# Patient Record
Sex: Female | Born: 1935 | Race: White | Hispanic: No | Marital: Married | Smoking: Never smoker
Health system: Southern US, Community
[De-identification: ages and names within clinical notes are randomized; demographics above are authoritative.]

## PROBLEM LIST (undated history)

## (undated) DIAGNOSIS — I493 Ventricular premature depolarization: Secondary | ICD-10-CM

## (undated) DIAGNOSIS — I491 Atrial premature depolarization: Secondary | ICD-10-CM

## (undated) DIAGNOSIS — I10 Essential (primary) hypertension: Secondary | ICD-10-CM

## (undated) DIAGNOSIS — E079 Disorder of thyroid, unspecified: Secondary | ICD-10-CM

## (undated) DIAGNOSIS — K5732 Diverticulitis of large intestine without perforation or abscess without bleeding: Secondary | ICD-10-CM

## (undated) HISTORY — DX: Ventricular premature depolarization: I49.3

## (undated) HISTORY — DX: Disorder of thyroid, unspecified: E07.9

## (undated) HISTORY — PX: STONE EXTRACTION WITH BASKET: SHX5318

## (undated) HISTORY — DX: Essential (primary) hypertension: I10

## (undated) HISTORY — DX: Diverticulitis of large intestine without perforation or abscess without bleeding: K57.32

## (undated) HISTORY — DX: Atrial premature depolarization: I49.1

---

## 1998-05-02 ENCOUNTER — Other Ambulatory Visit: Admission: RE | Admit: 1998-05-02 | Discharge: 1998-05-02 | Payer: Self-pay | Admitting: Internal Medicine

## 1999-11-20 ENCOUNTER — Ambulatory Visit (HOSPITAL_COMMUNITY): Admission: RE | Admit: 1999-11-20 | Discharge: 1999-11-20 | Payer: Self-pay | Admitting: Gastroenterology

## 2000-07-27 ENCOUNTER — Other Ambulatory Visit: Admission: RE | Admit: 2000-07-27 | Discharge: 2000-07-27 | Payer: Self-pay | Admitting: Internal Medicine

## 2001-08-02 ENCOUNTER — Other Ambulatory Visit: Admission: RE | Admit: 2001-08-02 | Discharge: 2001-08-02 | Payer: Self-pay | Admitting: Internal Medicine

## 2002-11-07 ENCOUNTER — Other Ambulatory Visit: Admission: RE | Admit: 2002-11-07 | Discharge: 2002-11-07 | Payer: Self-pay | Admitting: Internal Medicine

## 2006-01-04 ENCOUNTER — Other Ambulatory Visit: Admission: RE | Admit: 2006-01-04 | Discharge: 2006-01-04 | Payer: Self-pay | Admitting: Internal Medicine

## 2009-01-03 ENCOUNTER — Ambulatory Visit: Payer: Self-pay | Admitting: Internal Medicine

## 2009-07-08 ENCOUNTER — Ambulatory Visit: Payer: Self-pay | Admitting: Internal Medicine

## 2010-01-07 ENCOUNTER — Ambulatory Visit: Payer: Self-pay | Admitting: Internal Medicine

## 2010-01-17 ENCOUNTER — Ambulatory Visit: Payer: Self-pay | Admitting: Internal Medicine

## 2010-07-08 ENCOUNTER — Ambulatory Visit: Payer: Self-pay | Admitting: Internal Medicine

## 2011-01-16 ENCOUNTER — Ambulatory Visit (INDEPENDENT_AMBULATORY_CARE_PROVIDER_SITE_OTHER): Payer: Medicare Other | Admitting: Internal Medicine

## 2011-01-16 ENCOUNTER — Encounter: Payer: Self-pay | Admitting: Internal Medicine

## 2011-01-16 DIAGNOSIS — I499 Cardiac arrhythmia, unspecified: Secondary | ICD-10-CM

## 2011-01-16 DIAGNOSIS — Z23 Encounter for immunization: Secondary | ICD-10-CM

## 2011-01-16 DIAGNOSIS — F411 Generalized anxiety disorder: Secondary | ICD-10-CM

## 2011-01-16 DIAGNOSIS — E559 Vitamin D deficiency, unspecified: Secondary | ICD-10-CM

## 2011-01-16 DIAGNOSIS — F419 Anxiety disorder, unspecified: Secondary | ICD-10-CM

## 2011-01-16 DIAGNOSIS — I10 Essential (primary) hypertension: Secondary | ICD-10-CM

## 2011-01-16 DIAGNOSIS — Z0000 Encounter for general adult medical examination without abnormal findings: Secondary | ICD-10-CM

## 2011-01-16 DIAGNOSIS — M810 Age-related osteoporosis without current pathological fracture: Secondary | ICD-10-CM

## 2011-01-16 LAB — CBC WITH DIFFERENTIAL/PLATELET
Basophils Absolute: 0 10*3/uL (ref 0.0–0.1)
Eosinophils Relative: 1 % (ref 0–5)
HCT: 41.3 % (ref 36.0–46.0)
Lymphocytes Relative: 28 % (ref 12–46)
MCHC: 32.4 g/dL (ref 30.0–36.0)
MCV: 93.7 fL (ref 78.0–100.0)
Monocytes Absolute: 0.5 10*3/uL (ref 0.1–1.0)
Monocytes Relative: 9 % (ref 3–12)
RDW: 13.2 % (ref 11.5–15.5)
WBC: 5.7 10*3/uL (ref 4.0–10.5)

## 2011-01-16 LAB — POCT URINALYSIS DIPSTICK
Bilirubin, UA: NEGATIVE
Blood, UA: NEGATIVE
Glucose, UA: NEGATIVE
Ketones, UA: NEGATIVE
Spec Grav, UA: 1.025

## 2011-01-16 MED ORDER — TETANUS-DIPHTH-ACELL PERTUSSIS 5-2.5-18.5 LF-MCG/0.5 IM SUSP
0.5000 mL | Freq: Once | INTRAMUSCULAR | Status: AC
  Administered 2011-01-16: 0.5 mL via INTRAMUSCULAR

## 2011-01-17 LAB — COMPREHENSIVE METABOLIC PANEL
AST: 16 U/L (ref 0–37)
BUN: 21 mg/dL (ref 6–23)
CO2: 30 mEq/L (ref 19–32)
Calcium: 9.7 mg/dL (ref 8.4–10.5)
Chloride: 100 mEq/L (ref 96–112)
Creat: 0.96 mg/dL (ref 0.50–1.10)
Glucose, Bld: 91 mg/dL (ref 70–99)

## 2011-01-17 LAB — LIPID PANEL
Cholesterol: 203 mg/dL — ABNORMAL HIGH (ref 0–200)
HDL: 61 mg/dL (ref >39)
Total CHOL/HDL Ratio: 3.3 Ratio
Triglycerides: 68 mg/dL (ref <150)

## 2011-01-19 ENCOUNTER — Encounter: Payer: Self-pay | Admitting: Internal Medicine

## 2011-02-14 ENCOUNTER — Encounter: Payer: Self-pay | Admitting: Internal Medicine

## 2011-02-14 DIAGNOSIS — M810 Age-related osteoporosis without current pathological fracture: Secondary | ICD-10-CM | POA: Insufficient documentation

## 2011-02-14 DIAGNOSIS — I10 Essential (primary) hypertension: Secondary | ICD-10-CM | POA: Insufficient documentation

## 2011-02-14 DIAGNOSIS — E559 Vitamin D deficiency, unspecified: Secondary | ICD-10-CM | POA: Insufficient documentation

## 2011-02-14 NOTE — Progress Notes (Signed)
  Subjective:    Patient ID: Krystal Johns, female    DOB: Aug 10, 1923, 77 y.o.   MRN: [id number]  HPI pleasant white female with history of hypertension vitamin D deficiency and osteoporosis. Remote history of kidney stones status post stone basket extraction 1987. Has had urinary tract infections in 2003 in 2004. Had acute diverticulitis with septicemia May 1989. Last colonoscopy was 2001. Has declined further colonoscopies. Zostavax vaccine given today. No recent mammogram on file. Patient was reminded. Pneumovax immunization 2005. Patient takes vitamin D supplement daily. Takes baby aspirin daily. Is on HCTZ 25 mg daily and Zestril 20 mg daily for hypertension. She and her husband have felt a small [place] near family members. He has developed severe Parkinson's disease and is requiring some in-home assistance after [facility]. This is all been quite stressful for her. Some night she doesn't sleep well. She looks tired.    Review of Systems  Constitutional: Negative.   HENT: Negative.   Eyes: Negative.   Respiratory: Negative.   Cardiovascular: Negative.   Gastrointestinal: Negative.   Genitourinary: Negative.   Musculoskeletal: Negative.   Neurological: Negative.   Hematological: Negative.   Psychiatric/Behavioral: Negative.        Objective:   Physical Exam  Constitutional: She is oriented to person, place, and time. She appears well-nourished.  HENT:  Head: Normocephalic and atraumatic.  Right Ear: External ear normal.  Left Ear: External ear normal.  Mouth/Throat: Oropharynx is clear and moist. No oropharyngeal exudate.  Eyes: Pupils are equal, round, and reactive to light. No scleral icterus.  Neck: Neck supple. No JVD present. No thyromegaly present.  Cardiovascular: Normal rate, regular rhythm and normal heart sounds.   No murmur heard. Pulmonary/Chest: Effort normal and breath sounds normal. She has no wheezes.  Abdominal: Soft. Bowel sounds  are normal. She exhibits no distension and no mass. There is no tenderness.  Musculoskeletal: Normal range of motion. She exhibits no edema.  Lymphadenopathy:    She has no cervical adenopathy.  Neurological: She is alert and oriented to person, place, and time. She has normal reflexes. No cranial nerve deficit.  Skin: Skin is warm and dry. No rash noted. She is not diaphoretic.  Psychiatric: She has a normal mood and affect. Her behavior is normal. Judgment and thought content normal.          Assessment & Plan:  Hypertension well controlled on current regimen  Osteoporosis currently on vitamin D supplement . May take calcium supplements 600 mg twice daily  History of vitamin D deficiency  Remote history of kidney stone  History of urinary tract infections  History of acute diverticulitis with septicemia 1989  Plan patient is to return in 6 months for office visit and followup of hypertension and other issues

## 2011-02-16 ENCOUNTER — Other Ambulatory Visit: Payer: Self-pay | Admitting: *Deleted

## 2011-02-16 MED ORDER — LISINOPRIL 20 MG PO TABS: 20.0000 mg | ORAL_TABLET | Freq: Every day | ORAL | Status: DC

## 2011-03-10 ENCOUNTER — Ambulatory Visit (INDEPENDENT_AMBULATORY_CARE_PROVIDER_SITE_OTHER): Payer: Medicare Other | Admitting: Internal Medicine

## 2011-03-10 ENCOUNTER — Encounter: Payer: Self-pay | Admitting: Internal Medicine

## 2011-03-10 VITALS — BP 140/74 | Temp 98.2°F | Ht 60.0 in | Wt 103.0 lb

## 2011-03-10 DIAGNOSIS — I10 Essential (primary) hypertension: Secondary | ICD-10-CM

## 2011-03-10 DIAGNOSIS — L02429 Furuncle of limb, unspecified: Secondary | ICD-10-CM

## 2011-03-10 DIAGNOSIS — L02434 Carbuncle of left upper limb: Secondary | ICD-10-CM

## 2011-03-10 NOTE — Progress Notes (Signed)
  Subjective:    Patient ID: Krystal Johns, female    DOB: 05/20/1923, 77 y.o.   MRN: [id number]  HPI patient concerned about red tender nodule on her forearm. Does not recall any injury or an insect bite. Says that a few days ago there was a significant amount of surrounding erythema that has dissipated. No fever or shaking chills. Tetanus immunization update given 01/16/2011    Review of Systems     Objective:   Physical Exam palpable nodule about the size of a nickel on the dorsal left forearm that appears to have a tiny collection of pus at approximately 9 o'clock        Assessment & Plan:  Carbuncle left forearm  Advise warm hot compresses for 20 minutes 4 times daily. Prescription for doxycycline 100 mg twice daily for 10 days. Call if not better in 10 days or sooner if worse.

## 2011-03-10 NOTE — Patient Instructions (Signed)
Take doxycycline twice daily with food as prescribed. Apply warm hot compresses 20 minutes 4 times daily. Call if not better in 10 days or sooner if worse

## 2011-05-08 ENCOUNTER — Ambulatory Visit: Payer: Medicare Other | Admitting: Internal Medicine

## 2011-05-29 ENCOUNTER — Telehealth: Payer: Self-pay | Admitting: *Deleted

## 2011-05-29 NOTE — Telephone Encounter (Signed)
Error

## 2011-07-16 ENCOUNTER — Encounter (HOSPITAL_COMMUNITY): Payer: Self-pay | Admitting: Adult Health

## 2011-07-16 ENCOUNTER — Emergency Department (HOSPITAL_COMMUNITY)
Admission: EM | Admit: 2011-07-16 | Discharge: 2011-07-16 | Disposition: A | Discharge status: Home / Self Care | Payer: Medicare Other | Attending: Emergency Medicine | Admitting: Emergency Medicine

## 2011-07-16 ENCOUNTER — Emergency Department (HOSPITAL_COMMUNITY): Payer: Medicare Other

## 2011-07-16 ENCOUNTER — Telehealth: Payer: Self-pay | Admitting: Internal Medicine

## 2011-07-16 DIAGNOSIS — J40 Bronchitis, not specified as acute or chronic: Secondary | ICD-10-CM | POA: Insufficient documentation

## 2011-07-16 DIAGNOSIS — R509 Fever, unspecified: Secondary | ICD-10-CM | POA: Insufficient documentation

## 2011-07-16 DIAGNOSIS — R05 Cough: Secondary | ICD-10-CM | POA: Insufficient documentation

## 2011-07-16 DIAGNOSIS — Z7982 Long term (current) use of aspirin: Secondary | ICD-10-CM | POA: Insufficient documentation

## 2011-07-16 DIAGNOSIS — I10 Essential (primary) hypertension: Secondary | ICD-10-CM | POA: Insufficient documentation

## 2011-07-16 DIAGNOSIS — R059 Cough, unspecified: Secondary | ICD-10-CM | POA: Insufficient documentation

## 2011-07-16 MED ORDER — MOXIFLOXACIN HCL 400 MG PO TABS
400.0000 mg | ORAL_TABLET | Freq: Once | ORAL | Status: AC
  Administered 2011-07-16: 400 mg via ORAL
  Filled 2011-07-16: qty 1

## 2011-07-16 MED ORDER — MOXIFLOXACIN HCL 400 MG PO TABS: 400.0000 mg | ORAL_TABLET | Freq: Every day | ORAL | Status: AC

## 2011-07-16 NOTE — ED Notes (Signed)
Pt st's she has been feeling "sick" with a bad, non-productive cough x2 days.  Denies SOB, no chest or abdominal pain, no h/a, no n/v/d, also denies sore throat, no dizziness or confusion.  Lung sounds clear and equal bilaterally.  St's she had a 103 temp this am.

## 2011-07-16 NOTE — ED Notes (Signed)
Reports cough, fever and hoarseness for 2 days. Denies productive cough. Bilateral lung sounds clear.

## 2011-07-16 NOTE — ED Provider Notes (Signed)
History     CSN: [id number] Arrival date & time: 07/16/2011  5:33 PM   First MD Initiated Contact with Patient 07/16/11 2110      Chief Complaint  Patient presents with  . Cough  . Fever    (Consider location/radiation/quality/duration/timing/severity/associated sxs/prior treatment) HPI Comments: Pt with cough since yesterday, says that it was worse yesterday than today, low grade fever this am.  No cp/sob, no myalgias.  No dizziness.  Didn't want to come to ED, but home health nurse at home for her husband urged her to get check ed out  Patient is a 77 y.o. female presenting with cough. The history is provided by the patient.  Cough This is a new problem. The current episode started yesterday. Episode frequency: intermittently. The cough is non-productive. The maximum temperature recorded prior to her arrival was 100 to 100.9 F. The fever has been present for less than 1 day. Associated symptoms include rhinorrhea. Pertinent negatives include no chest pain, no chills, no sweats, no headaches, no myalgias, no shortness of breath and no wheezing. She has tried nothing for the symptoms. She is not a smoker.    Past Medical History  Diagnosis Date  . Diverticulitis of colon   . Osteoporosis   . Hypertension   . Vitamin D deficiency   . Asymptomatic PVCs   . PAC (premature atrial contraction)     asymptomatic    Past Surgical History  Procedure Date  . Stone extraction with basket     Family History  Problem Relation Age of Onset  . Heart disease Mother   . Hypertension Mother   . Heart disease Father   . Cancer Brother     History  Substance Use Topics  . Smoking status: Never Smoker   . Smokeless tobacco: Never Used  . Alcohol Use: No    OB History    Grav Para Term Preterm Abortions TAB SAB Ect Mult Living                  Review of Systems  Constitutional: Positive for fever. Negative for chills, diaphoresis and fatigue.  HENT: Positive for rhinorrhea.  Negative for congestion and sneezing.   Eyes: Negative.   Respiratory: Positive for cough. Negative for chest tightness, shortness of breath and wheezing.   Cardiovascular: Negative for chest pain and leg swelling.  Gastrointestinal: Negative for nausea, vomiting, abdominal pain, diarrhea and blood in stool.  Genitourinary: Negative for frequency, hematuria, flank pain and difficulty urinating.  Musculoskeletal: Negative for myalgias, back pain and arthralgias.  Skin: Negative for rash.  Neurological: Negative for dizziness, speech difficulty, weakness, numbness and headaches.    Allergies  Penicillins  Home Medications   Current Outpatient Rx  Name Route Sig Dispense Refill  . ASPIRIN 81 MG PO TBEC Oral Take 81 mg by mouth daily.      . CHOLECALCIFEROL 2000 UNITS PO CAPS Oral Take by mouth.      Marland Kitchen HYDROCHLOROTHIAZIDE 25 MG PO TABS Oral Take 25 mg by mouth daily.      Marland Kitchen LISINOPRIL 20 MG PO TABS Oral Take 1 tablet (20 mg total) by mouth daily. 30 tablet 5  . MOXIFLOXACIN HCL 400 MG PO TABS Oral Take 1 tablet (400 mg total) by mouth daily. 10 tablet 0    BP 135/62  Pulse 80  Temp(Src) 99.8 F (37.7 C) (Oral)  Resp 20  SpO2 95%  Physical Exam  Constitutional: She is oriented to person, place, and time.  She appears well-developed and well-nourished.  HENT:  Head: Normocephalic and atraumatic.  Eyes: Pupils are equal, round, and reactive to light.  Neck: Normal range of motion. Neck supple.  Cardiovascular: Normal rate, regular rhythm and normal heart sounds.   Pulmonary/Chest: Effort normal and breath sounds normal. No respiratory distress. She has no wheezes. She has no rales. She exhibits no tenderness.  Abdominal: Soft. Bowel sounds are normal. There is no tenderness. There is no rebound and no guarding.  Musculoskeletal: Normal range of motion. She exhibits no edema.  Lymphadenopathy:    She has no cervical adenopathy.  Neurological: She is alert and oriented to person,  place, and time.  Skin: Skin is warm and dry. No rash noted.  Psychiatric: She has a normal mood and affect.    ED Course  Procedures (including critical care time)  Dg Chest 2 View  07/16/2011  *RADIOLOGY REPORT*  Clinical Data: Cough and fever.  CHEST - 2 VIEW  Comparison: None.  Findings: There likely is a component of chronic lung disease.  No infiltrate, edema or pleural effusion identified.  Heart size is normal.  The bony thorax shows osteopenia and mild spondylosis of the thoracic spine.  IMPRESSION: No acute findings.  Original Report Authenticated By: Reola Calkins, M.D.      1. Bronchitis       MDM  No evidence of pneumonia.  Pt well appearing, will tx with abx for bronchitis given pt's age.  F/u with PMD        Rolan Bucco, MD 07/16/11 [phone]

## 2011-07-16 NOTE — Telephone Encounter (Signed)
Spoke with nurse, Odette Horns, regarding patient and advised caretaker to have family take her to ER.  Dr. Lenord Fellers aware.

## 2012-01-07 ENCOUNTER — Other Ambulatory Visit: Payer: Self-pay

## 2012-01-07 MED ORDER — LISINOPRIL 20 MG PO TABS: 20.0000 mg | ORAL_TABLET | Freq: Every day | ORAL | Status: DC

## 2012-01-08 ENCOUNTER — Other Ambulatory Visit: Payer: Self-pay

## 2012-07-12 ENCOUNTER — Other Ambulatory Visit: Payer: Self-pay

## 2012-07-12 MED ORDER — LISINOPRIL 20 MG PO TABS: 20.0000 mg | ORAL_TABLET | Freq: Every day | ORAL | Status: DC

## 2012-09-16 ENCOUNTER — Other Ambulatory Visit: Payer: Self-pay

## 2012-09-16 MED ORDER — HYDROCHLOROTHIAZIDE 25 MG PO TABS: 25.0000 mg | ORAL_TABLET | Freq: Every day | ORAL | Status: DC

## 2013-06-07 ENCOUNTER — Ambulatory Visit (INDEPENDENT_AMBULATORY_CARE_PROVIDER_SITE_OTHER): Payer: Medicare Other | Admitting: Internal Medicine

## 2013-06-07 ENCOUNTER — Encounter: Payer: Self-pay | Admitting: Internal Medicine

## 2013-06-07 VITALS — BP 120/70 | HR 64 | Temp 97.6°F | Wt 120.0 lb

## 2013-06-07 DIAGNOSIS — F411 Generalized anxiety disorder: Secondary | ICD-10-CM | POA: Diagnosis not present

## 2013-06-07 DIAGNOSIS — I10 Essential (primary) hypertension: Secondary | ICD-10-CM | POA: Diagnosis not present

## 2013-06-07 DIAGNOSIS — F4321 Adjustment disorder with depressed mood: Secondary | ICD-10-CM

## 2013-06-07 MED ORDER — ALPRAZOLAM 0.25 MG PO TABS: 0.2500 mg | ORAL_TABLET | Freq: Two times a day (BID) | ORAL | Status: DC

## 2013-06-07 NOTE — Patient Instructions (Addendum)
Take Xanax as directed for anxiety. Flu vaccine declined.   Let's defer physical until after first of the year

## 2013-06-18 ENCOUNTER — Encounter: Payer: Self-pay | Admitting: Internal Medicine

## 2013-06-18 NOTE — Progress Notes (Signed)
  Subjective:    Patient ID: Krystal Johns, female    DOB: 1923-01-09, 79 y.o.   MRN: [id number]  HPI  79 year old white female with history of hypertension caring for husband with Parkinson's disease at home with help in today to discuss situation with her husband and anxiety. Husband has been placed under hospice care with urination and his situation with Parkinson's disease. They plan for him to remain at home. She is sleeping not so well these days. Her affect is entirely appropriate. This is a difficult situation for her. We talked about this for 25 minutes.    Review of Systems     Objective:   Physical Exam  Chest clear to auscultation. Cardiac exam regular rate and rhythm normal S1 and S2. Extremities without edema        Assessment & Plan:  Anxiety  Hypertension  Plan: Continue same medications previously prescribed for hypertension.     Grief reaction secondary to husband's illness  Hypertension-stable on treatment  Anxiety  Plan: Continue antihypertensive medication previously prescribed. Take Xanax 0.25 mg twice daily as needed for anxiety. Defer physical exam until after the first of the year. Flu vaccine declined.

## 2013-07-25 ENCOUNTER — Other Ambulatory Visit: Payer: Self-pay | Admitting: Internal Medicine

## 2013-08-24 ENCOUNTER — Other Ambulatory Visit: Payer: Self-pay | Admitting: Internal Medicine

## 2013-08-24 NOTE — Telephone Encounter (Signed)
Refill Xanax for 6 months 

## 2013-09-11 ENCOUNTER — Telehealth: Payer: Self-pay | Admitting: Internal Medicine

## 2013-09-11 NOTE — Telephone Encounter (Signed)
Pt's husband died recently of complications of Parkinson's disease. [facility] was last week. Called to check on patient.

## 2013-10-23 ENCOUNTER — Other Ambulatory Visit: Payer: Self-pay | Admitting: Internal Medicine

## 2014-01-23 ENCOUNTER — Other Ambulatory Visit: Payer: Self-pay | Admitting: Internal Medicine

## 2014-04-16 ENCOUNTER — Other Ambulatory Visit: Payer: Self-pay

## 2014-04-16 MED ORDER — ALPRAZOLAM 0.25 MG PO TABS: 0.2500 mg | ORAL_TABLET | Freq: Two times a day (BID) | ORAL | Status: DC | PRN

## 2014-07-31 ENCOUNTER — Other Ambulatory Visit: Payer: Self-pay | Admitting: Internal Medicine

## 2014-07-31 NOTE — Telephone Encounter (Signed)
Not seen since October 2014. Husband was sick and passed away January 2015. Needs OV for BP check and MD visit. Refill med x 30 days and schedule OV not PE. Can do PE later.

## 2014-07-31 NOTE — Telephone Encounter (Signed)
Lisinopril refilled for 1 month Patient advised to schedule follow up appt

## 2014-09-03 ENCOUNTER — Other Ambulatory Visit: Payer: Self-pay | Admitting: Internal Medicine

## 2014-09-18 ENCOUNTER — Encounter: Payer: Self-pay | Admitting: Internal Medicine

## 2014-09-18 ENCOUNTER — Ambulatory Visit (INDEPENDENT_AMBULATORY_CARE_PROVIDER_SITE_OTHER): Payer: Medicare Other | Admitting: Internal Medicine

## 2014-09-18 VITALS — BP 140/80 | HR 75 | Temp 97.6°F | Wt 129.0 lb

## 2014-09-18 DIAGNOSIS — F4321 Adjustment disorder with depressed mood: Secondary | ICD-10-CM

## 2014-09-18 DIAGNOSIS — I10 Essential (primary) hypertension: Secondary | ICD-10-CM | POA: Diagnosis not present

## 2014-09-18 DIAGNOSIS — R5383 Other fatigue: Secondary | ICD-10-CM | POA: Diagnosis not present

## 2014-09-18 DIAGNOSIS — M810 Age-related osteoporosis without current pathological fracture: Secondary | ICD-10-CM | POA: Diagnosis not present

## 2014-09-18 DIAGNOSIS — Z136 Encounter for screening for cardiovascular disorders: Secondary | ICD-10-CM | POA: Diagnosis not present

## 2014-09-18 DIAGNOSIS — Z0000 Encounter for general adult medical examination without abnormal findings: Secondary | ICD-10-CM | POA: Diagnosis not present

## 2014-09-18 DIAGNOSIS — G4700 Insomnia, unspecified: Secondary | ICD-10-CM | POA: Diagnosis not present

## 2014-09-18 DIAGNOSIS — E559 Vitamin D deficiency, unspecified: Secondary | ICD-10-CM

## 2014-09-18 DIAGNOSIS — Z13220 Encounter for screening for lipoid disorders: Secondary | ICD-10-CM

## 2014-09-18 LAB — CBC WITH DIFFERENTIAL/PLATELET
BASOS PCT: 1 % (ref 0–1)
Basophils Absolute: 0.1 10*3/uL (ref 0.0–0.1)
EOS PCT: 1 % (ref 0–5)
Eosinophils Absolute: 0.1 10*3/uL (ref 0.0–0.7)
HCT: 41.6 % (ref 36.0–46.0)
HEMOGLOBIN: 13.7 g/dL (ref 12.0–15.0)
Lymphocytes Relative: 30 % (ref 12–46)
Lymphs Abs: 1.8 10*3/uL (ref 0.7–4.0)
MCH: 30.8 pg (ref 26.0–34.0)
MCHC: 32.9 g/dL (ref 30.0–36.0)
MCV: 93.5 fL (ref 78.0–100.0)
MPV: 10.4 fL (ref 8.6–12.4)
Monocytes Absolute: 0.5 10*3/uL (ref 0.1–1.0)
Monocytes Relative: 9 % (ref 3–12)
Neutro Abs: 3.5 10*3/uL (ref 1.7–7.7)
Neutrophils Relative %: 59 % (ref 43–77)
Platelets: 302 10*3/uL (ref 150–400)
RBC: 4.45 MIL/uL (ref 3.87–5.11)
RDW: 13.7 % (ref 11.5–15.5)
WBC: 5.9 10*3/uL (ref 4.0–10.5)

## 2014-09-18 LAB — COMPREHENSIVE METABOLIC PANEL
ALK PHOS: 64 U/L (ref 39–117)
ALT: 11 U/L (ref 0–35)
AST: 13 U/L (ref 0–37)
Albumin: 3.9 g/dL (ref 3.5–5.2)
BUN: 22 mg/dL (ref 6–23)
CHLORIDE: 104 meq/L (ref 96–112)
CO2: 29 mEq/L (ref 19–32)
CREATININE: 0.91 mg/dL (ref 0.50–1.10)
Calcium: 9.3 mg/dL (ref 8.4–10.5)
GLUCOSE: 88 mg/dL (ref 70–99)
POTASSIUM: 4.5 meq/L (ref 3.5–5.3)
Sodium: 140 mEq/L (ref 135–145)
TOTAL PROTEIN: 6.8 g/dL (ref 6.0–8.3)
Total Bilirubin: 0.5 mg/dL (ref 0.2–1.2)

## 2014-09-18 LAB — LIPID PANEL
CHOLESTEROL: 226 mg/dL — AB (ref 0–200)
HDL: 57 mg/dL (ref >39)
LDL Cholesterol: 142 mg/dL — ABNORMAL HIGH (ref 0–99)
TRIGLYCERIDES: 135 mg/dL (ref <150)
Total CHOL/HDL Ratio: 4 Ratio
VLDL: 27 mg/dL (ref 0–40)

## 2014-09-18 NOTE — Patient Instructions (Signed)
Fasting labs drawn today. Physical exam to be done in 6 months. Continue same medications. Remind patient to take blood pressure medicine before coming to office.

## 2014-09-18 NOTE — Progress Notes (Signed)
   Subjective:    Patient ID: Krystal AlbaMargie Johns, female    DOB: 03/19/1923, 81 y.o.   MRN: [id number]  HPI  81 year old White Female with history of hypertension, anxiety, osteoporosis. Husband died a year ago this past January of complications  of Parkinson's disease. She misses him a great deal. Is tearful in the office today when talking about him. She resides close to family. She resides  alone. She does not drive. Has not had any recent falls. History of osteoporosis. Has not had blood pressure medication this morning. Blood pressure was initially elevated upon arrival but improved. She has a history of asymptomatic PACs. Says her appetite is good. She was taking antianxiety medication to sleep but tried to quit taking that. Sometimes falls asleep in a chair and then is up for a while. Some night sleeps better than others. Told her was okay to take anti-anxiety medication if needed.  Has been on ACE inhibitor and HCTZ for many years for essential hypertension but did not take medication before coming to office today.     Review of Systems  no issues with appetite, constipation, urinary tract infection symptoms      Objective:   Physical Exam  Skin warm and dry. Nodes none. Neck supple without JVD thyromegaly or carotid bruits. Chest clear to auscultation. Cardiac exam regular rate and rhythm occasional irregular contraction. Extremities without edema. Affect alert, cooperative, with no overt signs of memory loss      Assessment & Plan:   Essential hypertension  Grief reaction  Osteoporosis  Insomnia  History of asymptomatic PACs  Plan: Continue current medications. Fasting labs pending. Reminded patient to take blood pressure medicine before coming to office. This: Exam scheduled in 6 months.

## 2014-09-19 ENCOUNTER — Telehealth: Payer: Self-pay | Admitting: *Deleted

## 2014-09-19 LAB — TSH: TSH: 4.537 u[IU]/mL — ABNORMAL HIGH (ref 0.350–4.500)

## 2014-09-19 LAB — VITAMIN D 25 HYDROXY (VIT D DEFICIENCY, FRACTURES): VIT D 25 HYDROXY: 23 ng/mL — AB (ref 30–100)

## 2014-09-19 MED ORDER — LEVOTHYROXINE SODIUM 50 MCG PO TABS: 50.0000 ug | ORAL_TABLET | Freq: Every day | ORAL | Status: DC

## 2014-09-19 NOTE — Telephone Encounter (Signed)
Reviewed lab work with patient script sent to pharmacy for levothyroxine . Patient will get Vit D3 at her pharmacy she will call back to set up for TSH draw for 2 months

## 2014-11-02 ENCOUNTER — Other Ambulatory Visit: Payer: Self-pay | Admitting: Internal Medicine

## 2014-11-20 ENCOUNTER — Other Ambulatory Visit: Payer: Medicare Other | Admitting: Internal Medicine

## 2014-11-20 DIAGNOSIS — E039 Hypothyroidism, unspecified: Secondary | ICD-10-CM

## 2014-11-20 LAB — TSH: TSH: 0.176 u[IU]/mL — ABNORMAL LOW (ref 0.350–4.500)

## 2014-11-22 ENCOUNTER — Encounter: Payer: Self-pay | Admitting: Internal Medicine

## 2014-11-22 ENCOUNTER — Ambulatory Visit (INDEPENDENT_AMBULATORY_CARE_PROVIDER_SITE_OTHER): Payer: Medicare Other | Admitting: Internal Medicine

## 2014-11-22 VITALS — BP 108/68 | HR 69 | Temp 97.2°F | Wt 126.0 lb

## 2014-11-22 DIAGNOSIS — L570 Actinic keratosis: Secondary | ICD-10-CM

## 2014-11-22 DIAGNOSIS — E7800 Pure hypercholesterolemia, unspecified: Secondary | ICD-10-CM

## 2014-11-22 DIAGNOSIS — E039 Hypothyroidism, unspecified: Secondary | ICD-10-CM

## 2014-11-22 DIAGNOSIS — R011 Cardiac murmur, unspecified: Secondary | ICD-10-CM | POA: Insufficient documentation

## 2014-11-22 DIAGNOSIS — E559 Vitamin D deficiency, unspecified: Secondary | ICD-10-CM | POA: Diagnosis not present

## 2014-11-22 DIAGNOSIS — I10 Essential (primary) hypertension: Secondary | ICD-10-CM | POA: Diagnosis not present

## 2014-11-22 MED ORDER — LEVOTHYROXINE SODIUM 25 MCG PO TABS: 25.0000 ug | ORAL_TABLET | Freq: Every day | ORAL | Status: DC

## 2014-11-22 MED ORDER — TRIAMCINOLONE ACETONIDE 0.1 % EX CREA: 1.0000 "application " | TOPICAL_CREAM | Freq: Three times a day (TID) | CUTANEOUS | Status: DC

## 2014-11-22 NOTE — Patient Instructions (Signed)
Use triamcinolone cream 3 times daily on keratosis. Decrease dose of levothyroxine to 0.025 mg daily. Multivitamin daily. Follow-up in August.

## 2014-11-22 NOTE — Progress Notes (Signed)
   Subjective:    Patient ID: Krystal Johns, female    DOB: 08/12/1923, 81 y.o.   MRN: [id number]  HPI she was here in February and was found to have a slightly elevated TSH of 4.537. 3 years ago it was 4.211. She has a history of hypertension. Continues on antihypertensive medication and blood pressure is stable. Had thyroid checked earlier this week and TSH was 0.176 on Synthroid 0.05 mg daily. Were going to reduce the dose to 0.025 mg daily and follow-up with her at time of physical exam early August. She also has a lesion on her left arm near the olecranon fossa started out as a type of keratosis and she says peeled off and never itched to speak of. She's concerned about it because it has some hyperpigmentation. It's about the size of a nickel.    Review of Systems     Objective:   Physical Exam  Erythematous nickel size lesion left inner forearm near elbow. No evidence of secondary bacterial infection. Slight scale noted. No thyromegaly. Chest clear. Cardiac exam 2/6 systolic ejection murmur, regular rate and rhythm, extremities without edema.      Assessment & Plan:  Hypothyroidism-decrease levothyroxine dose to 0.025 mg daily and follow-up in August  Keratosis left arm-suspect it was irritated at some point. Prescribed triamcinolone cream to apply 3 times daily and if not improved in 4 weeks to see dermatologist  Essential hypertension-stable medication  Vitamin D deficiency-take multivitamin daily  Plan: For physical exam early August. We'll repeat TSH at that time. She had lab work in February including lipid panel. She has elevated cholesterol but her age were not going to treat that. She has slightly low vitamin D level which can be supplemented with multivitamin daily.

## 2014-12-06 ENCOUNTER — Emergency Department (INDEPENDENT_AMBULATORY_CARE_PROVIDER_SITE_OTHER): Payer: Medicare Other

## 2014-12-06 ENCOUNTER — Emergency Department (INDEPENDENT_AMBULATORY_CARE_PROVIDER_SITE_OTHER)
Admission: EM | Admit: 2014-12-06 | Discharge: 2014-12-06 | Disposition: A | Discharge status: Home / Self Care | Payer: Medicare Other | Source: Home / Self Care | Attending: Family Medicine | Admitting: Family Medicine

## 2014-12-06 ENCOUNTER — Encounter (HOSPITAL_COMMUNITY): Payer: Self-pay | Admitting: Family Medicine

## 2014-12-06 ENCOUNTER — Emergency Department (HOSPITAL_COMMUNITY): Payer: Medicare Other

## 2014-12-06 DIAGNOSIS — S20211A Contusion of right front wall of thorax, initial encounter: Secondary | ICD-10-CM | POA: Diagnosis not present

## 2014-12-06 DIAGNOSIS — M25561 Pain in right knee: Secondary | ICD-10-CM | POA: Diagnosis not present

## 2014-12-06 DIAGNOSIS — W19XXXA Unspecified fall, initial encounter: Secondary | ICD-10-CM

## 2014-12-06 DIAGNOSIS — S0993XA Unspecified injury of face, initial encounter: Secondary | ICD-10-CM | POA: Diagnosis not present

## 2014-12-06 DIAGNOSIS — R0781 Pleurodynia: Secondary | ICD-10-CM | POA: Diagnosis not present

## 2014-12-06 DIAGNOSIS — S299XXA Unspecified injury of thorax, initial encounter: Secondary | ICD-10-CM | POA: Diagnosis not present

## 2014-12-06 NOTE — Discharge Instructions (Signed)
Fortunately he has not sustained any fractures to your face, rib cage, or right knee. This pain will be best treated with Tylenol 1 g every 8 hours and ibuprofen 600 mg every 6 hours as needed for additional pain relief. Please stay active and take plenty of deep breaths.

## 2014-12-06 NOTE — ED Provider Notes (Addendum)
CSN: [id number]     Arrival date & time 12/06/14  1019 History   First MD Initiated Contact with Patient 12/06/14 1152     Chief Complaint  Patient presents with  . Fall   (Consider location/radiation/quality/duration/timing/severity/associated sxs/prior Treatment) HPI   1 day ago pt fell. Pt reports standing for a prolonged period of time and then started feeling dizzy. Reports trying to sit but loss balance and hit face on ground. Dizzy feeling left w/in seconds. Initially w/ bloody nose out of L nare for a few hours. Denies confusion, HA, foggy feeling. Since the fall pt w/ R knee pain, R rib pain and nasal pain. Rib pain worse w/ deep inspiration.   Denies shortness of breath, palpitations, chest pain, nausea, vomiting, diarrhea, constipation, abdominal pain, fevers.    Past Medical History  Diagnosis Date  . Diverticulitis of colon   . Osteoporosis   . Hypertension   . Vitamin D deficiency   . Asymptomatic PVCs   . PAC (premature atrial contraction)     asymptomatic   Past Surgical History  Procedure Laterality Date  . Stone extraction with basket     Family History  Problem Relation Age of Onset  . Heart disease Mother   . Hypertension Mother   . Heart disease Father   . Cancer Brother    History  Substance Use Topics  . Smoking status: Never Smoker   . Smokeless tobacco: Never Used  . Alcohol Use: No   OB History    No data available     Review of Systems Per HPI with all other pertinent systems negative.   Allergies  Penicillins  Home Medications   Prior to Admission medications   Medication Sig Start Date End Date Taking? Authorizing Provider  hydrochlorothiazide (HYDRODIURIL) 25 MG tablet TAKE 1 TABLET BY MOUTH ONCE DAILY. 11/02/14   Margaree MackintoshMary J Baxley, MD  levothyroxine (LEVOTHROID) 25 MCG tablet Take 1 tablet (25 mcg total) by mouth daily before breakfast. 11/22/14   Margaree MackintoshMary J Baxley, MD  lisinopril (PRINIVIL,ZESTRIL) 20 MG tablet TAKE 1 TABLET BY MOUTH  DAILY. NEED OFFICE VISIT FOR FURTHER REFILLS 09/03/14   Margaree MackintoshMary J Baxley, MD  triamcinolone cream (KENALOG) 0.1 % Apply 1 application topically 3 (three) times daily. 11/22/14   Margaree MackintoshMary J Baxley, MD   BP 147/75 mmHg  Pulse 78  Temp(Src) 98.2 F (36.8 C) (Oral)  Resp 12  SpO2 94% Physical Exam Physical Exam  Constitutional: oriented to person, place, and time. appears well-developed and well-nourished. No distress.  HENT:  Nasal bridge with overlying ecchymoses with dissolution primarily to the right and in the infraorbital region. Mild nasal bone tenderness to palpation but no instability. Head: Normocephalic and atraumatic.  Eyes: EOMI. PERRL.  Neck: Normal range of motion.  Cardiovascular: RRR, no m/r/g, 2+ distal pulses,  Pulmonary/Chest: Effort normal and breath sounds normal. No respiratory distress.  Abdominal: Soft. Bowel sounds are normal. NonTTP, no distension.  Musculoskeletal: Right knee full range of motion without effusion noted. Lockman's, valgus and varus stresses without laxity. Minimal point tenderness on the medial joint line. Right rib tenderness in the approximately 10th to 12th rib region.  Neurological: alert and oriented to person, place, and time.  cranial nerves II through XII intact, moves all extremities in a coordinated fashion. Skin: Skin is warm. No rash noted. non diaphoretic.  Psychiatric: normal mood and affect. behavior is normal. Judgment and thought content normal.   ED Course  Procedures (including critical care time) Labs  Review Labs Reviewed - No data to display  Imaging Review Dg Ribs Unilateral W/chest Right  12/06/2014   CLINICAL DATA:  Knees gave out while walking to table and patient fell face first onto floor 1 day ago, RIGHT rib pain inferiorly, history hypertension  EXAM: RIGHT RIBS AND CHEST - 3+ VIEW  COMPARISON:  Chest radiographs 07/16/2011  FINDINGS: Upper normal heart size.  Normal mediastinal contours and pulmonary vascularity.   Emphysematous and bronchitic changes consistent with COPD.  No acute infiltrate, pleural effusion or pneumothorax.  Bones demineralized.  No definite rib fracture or bone destruction.  IMPRESSION: COPD changes.  Osseous demineralization.  No acute abnormalities.   Electronically Signed   By: Ulyses Southward M.D.   On: 12/06/2014 13:00     MDM   1. Fall, initial encounter   2. Rib contusion, right, initial encounter   3. Facial trauma, initial encounter   4. Right knee pain    Fall w/o fracture to face/nose, ribs, or knee. Little concern for intracranial process due to low impact and length of time since injury. Pt aware of need for emergent evaluation if develops concerning symptoms.   Tylenol 1gm Q8 Ibuprofen 400-600mg  Q6 PRN for pain.    Ozella Rocks, MD 12/06/14 1318  Ozella Rocks, MD 12/06/14 [phone]

## 2014-12-06 NOTE — ED Notes (Signed)
See provider's note

## 2015-02-01 ENCOUNTER — Other Ambulatory Visit: Payer: Self-pay | Admitting: Internal Medicine

## 2015-02-14 ENCOUNTER — Encounter: Payer: Self-pay | Admitting: Internal Medicine

## 2015-02-14 ENCOUNTER — Ambulatory Visit (INDEPENDENT_AMBULATORY_CARE_PROVIDER_SITE_OTHER): Payer: Medicare Other | Admitting: Internal Medicine

## 2015-02-14 VITALS — BP 126/84 | HR 90 | Temp 98.4°F | Wt 124.0 lb

## 2015-02-14 DIAGNOSIS — R350 Frequency of micturition: Secondary | ICD-10-CM | POA: Diagnosis not present

## 2015-02-14 DIAGNOSIS — R8299 Other abnormal findings in urine: Secondary | ICD-10-CM | POA: Diagnosis not present

## 2015-02-14 DIAGNOSIS — N390 Urinary tract infection, site not specified: Secondary | ICD-10-CM | POA: Diagnosis not present

## 2015-02-14 DIAGNOSIS — R8290 Unspecified abnormal findings in urine: Secondary | ICD-10-CM | POA: Diagnosis not present

## 2015-02-14 LAB — POCT URINALYSIS DIPSTICK
Bilirubin, UA: NEGATIVE
Glucose, UA: NEGATIVE
Ketones, UA: NEGATIVE
Nitrite, UA: NEGATIVE
PROTEIN UA: NEGATIVE
Spec Grav, UA: <=1.005
Urobilinogen, UA: NEGATIVE
pH, UA: 6.5

## 2015-02-14 MED ORDER — CEFTRIAXONE SODIUM 1 G IJ SOLR
1.0000 g | Freq: Once | INTRAMUSCULAR | Status: AC
  Administered 2015-02-14: 1 g via INTRAMUSCULAR

## 2015-02-14 MED ORDER — CEPHALEXIN 500 MG PO CAPS: 500.0000 mg | ORAL_CAPSULE | Freq: Four times a day (QID) | ORAL | Status: DC

## 2015-02-14 NOTE — Progress Notes (Signed)
   Subjective:    Patient ID: Krystal Johns, female    DOB: 07/03/1923, 81 y.o.   MRN: [id number]  HPI Onset last week of UTI symptoms. Has had dysuria. No back pain, no fever, no shaking chills, no nausea or vomiting. No recent UTI since 2004. At that time she had Escherichia coli UTI sensitive to Keflex and Cipro Also having issues with insomnia. Still having grief over loss of husband. Gets to bed at night in mind starts racing. She has Xanax on hand but is been afraid to take it.   Review of Systems     Objective:   Physical Exam  Urine dipstick abnormal showing 3+ LE and occult blood. Culture sent. No CVA tenderness.      Assessment & Plan:  Acute UTI  Insomnia  Grief reaction  Plan: 1 g IM Rocephin given in office. Keflex 500 mg 4 times daily for 10 days. Takes Xanax as previously prescribed. May take one half tablet one hour before bedtime initially and increase to one whole tablet if needed

## 2015-02-14 NOTE — Patient Instructions (Addendum)
Rocephin 1 g IM. Keflex 500 mg 4 times daily for 10 days. Culture pending. Takes Xanax as previously prescribed for insomnia

## 2015-02-16 LAB — URINE CULTURE

## 2015-02-19 ENCOUNTER — Telehealth: Payer: Self-pay | Admitting: *Deleted

## 2015-02-19 NOTE — Telephone Encounter (Signed)
Patient states she is much improved. Instructed patient to complete her course of antibiotics. Patient verbalized understanding.

## 2015-03-26 ENCOUNTER — Encounter: Payer: Self-pay | Admitting: Internal Medicine

## 2015-03-26 ENCOUNTER — Ambulatory Visit (INDEPENDENT_AMBULATORY_CARE_PROVIDER_SITE_OTHER): Payer: Medicare Other | Admitting: Internal Medicine

## 2015-03-26 VITALS — BP 120/74 | HR 71 | Temp 97.4°F | Ht 60.0 in | Wt 123.0 lb

## 2015-03-26 DIAGNOSIS — E039 Hypothyroidism, unspecified: Secondary | ICD-10-CM | POA: Diagnosis not present

## 2015-03-26 DIAGNOSIS — I10 Essential (primary) hypertension: Secondary | ICD-10-CM | POA: Diagnosis not present

## 2015-03-26 DIAGNOSIS — I491 Atrial premature depolarization: Secondary | ICD-10-CM

## 2015-03-26 DIAGNOSIS — M810 Age-related osteoporosis without current pathological fracture: Secondary | ICD-10-CM | POA: Diagnosis not present

## 2015-03-26 DIAGNOSIS — Z8719 Personal history of other diseases of the digestive system: Secondary | ICD-10-CM

## 2015-03-26 DIAGNOSIS — Z8639 Personal history of other endocrine, nutritional and metabolic disease: Secondary | ICD-10-CM | POA: Diagnosis not present

## 2015-03-26 DIAGNOSIS — Z0000 Encounter for general adult medical examination without abnormal findings: Secondary | ICD-10-CM | POA: Diagnosis not present

## 2015-03-26 DIAGNOSIS — Z87442 Personal history of urinary calculi: Secondary | ICD-10-CM | POA: Diagnosis not present

## 2015-03-26 DIAGNOSIS — E785 Hyperlipidemia, unspecified: Secondary | ICD-10-CM

## 2015-03-26 DIAGNOSIS — Z23 Encounter for immunization: Secondary | ICD-10-CM

## 2015-03-26 DIAGNOSIS — F411 Generalized anxiety disorder: Secondary | ICD-10-CM

## 2015-03-26 LAB — POCT URINALYSIS DIPSTICK
BILIRUBIN UA: NEGATIVE
Glucose, UA: NEGATIVE
Ketones, UA: NEGATIVE
Leukocytes, UA: NEGATIVE
Nitrite, UA: NEGATIVE
PH UA: 7
Protein, UA: NEGATIVE
RBC UA: NEGATIVE
Spec Grav, UA: 1.01
Urobilinogen, UA: NEGATIVE

## 2015-04-09 ENCOUNTER — Telehealth (INDEPENDENT_AMBULATORY_CARE_PROVIDER_SITE_OTHER): Payer: Medicare Other | Admitting: *Deleted

## 2015-04-09 DIAGNOSIS — R195 Other fecal abnormalities: Secondary | ICD-10-CM

## 2015-04-09 LAB — HEMOCCULT GUIAC POC 1CARD (OFFICE)
Card #2 Fecal Occult Blod, POC: NEGATIVE
Card #3 Fecal Occult Blood, POC: NEGATIVE
Fecal Occult Blood, POC: NEGATIVE

## 2015-04-09 NOTE — Telephone Encounter (Signed)
Patient returned Hemoccult cards. Results negative for blood.

## 2015-04-12 DIAGNOSIS — Z1231 Encounter for screening mammogram for malignant neoplasm of breast: Secondary | ICD-10-CM | POA: Diagnosis not present

## 2015-04-25 ENCOUNTER — Other Ambulatory Visit: Payer: Self-pay | Admitting: Internal Medicine

## 2015-04-25 NOTE — Telephone Encounter (Signed)
Refill x 6 months 

## 2015-04-26 NOTE — Telephone Encounter (Signed)
Rosalita Chessman, sending to you so you can e-scribe this for patient.

## 2015-05-16 NOTE — Progress Notes (Signed)
Subjective:    Patient ID: Krystal Johns, female    DOB: 1923/02/04, 81 y.o.   MRN: [id number]  HPI 81 year old white female with history of hypertension, osteoporosis anxiety in today for health maintenance exam and evaluation of medical issues. Has been on ACE inhibitor and HCTZ for many years for hypertension.  She stayed at home and took care of her husband with assistance until he died of convocation as a Parkinson's disease January 2015. No children. She does not drive. 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 He was part owner in [place] in [place] for many years.  She has a history of asymptomatic PACs. Remote history of kidney stones status post stone basket extraction 1987. History of vitamin D deficiency. Had urinary tract infections in 2003 in 2004. Had acute diverticulitis with septicemia in May 1989. Last colonoscopy was in 2001 and has climbed further colonoscopies. Zostavax vaccine given 2012. No recent mammogram on file. Pneumovax immunization 2005. Patient takes baby aspirin daily. Takes HCTZ and Zestril.  Patient apparently allergic to penicillin.  Patient had displaced Colles' fracture right wrist 1996. Fractured third metatarsal left foot 1987.  Family history: Brother died 02-26-2009of lung cancer with history of MI. Father died at age 7 of heart failure. Mother died at age 32 with heart disease and history of hypertension. Total of 6 sisters. 3 living. One with hypertension.  Patient does not smoke or consume alcohol.  Had miscarriage 36.    Review of Systems  Constitutional: Negative.   Respiratory: Negative.   Cardiovascular: Negative.   Gastrointestinal: Negative.   Genitourinary: Negative.        Objective:   Physical Exam  Constitutional: She is oriented to person, place, and time. She appears well-developed and well-nourished. No distress.  HENT:  Head: Normocephalic and atraumatic.  Right Ear: External ear normal.  Left  Ear: External ear normal.  Mouth/Throat: Oropharynx is clear and moist.  Eyes: Right eye exhibits no discharge. Left eye exhibits no discharge. No scleral icterus.  Neck: Neck supple. No JVD present. No thyromegaly present.  Cardiovascular: Normal rate.   Murmur heard. Frequent irregular contractions consistent with PACs  1 to 2/6 systolic ejection murmur  Pulmonary/Chest: Breath sounds normal. No respiratory distress. She has no rales. She exhibits no tenderness.  Abdominal: Bowel sounds are normal. She exhibits no distension and no mass. There is no tenderness. There is no rebound and no guarding.  Genitourinary:  Deferred due to age  Lymphadenopathy:    She has no cervical adenopathy.  Neurological: She is alert and oriented to person, place, and time. She has normal reflexes. She displays normal reflexes. No cranial nerve deficit. Coordination normal.  Skin: Skin is warm and dry. No rash noted. She is not diaphoretic.  Psychiatric: She has a normal mood and affect. Her behavior is normal. Judgment and thought content normal.          Assessment & Plan:  Anxiety  Essential hypertension  Osteoporosis  History of asymptomatic PACs  Remote history of diverticulosis  Occasional urinary tract infection  Hypothyroidism  Hyperlipidemia  Plan: Return in 6 months or as needed.  Subjective:   Patient presents for Medicare Annual/Subsequent preventive examination.  Review Past Medical/Family/Social: See above   Risk Factors  Current exercise habits:  Sedentary Dietary issues discussed: Low fat low carbohydrate  Cardiac risk factors: Essential hypertension  Depression Screen  (Note: if answer to either of the following is "Yes", a more complete depression  screening is indicated)   Over the past two weeks, have you felt down, depressed or hopeless? No  Over the past two weeks, have you felt little interest or pleasure in doing things? No Have you lost interest or  pleasure in daily life? No Do you often feel hopeless? No Do you cry easily over simple problems? No   Activities of Daily Living  In your present state of health, do you have any difficulty performing the following activities?:   Driving? Does not drive Managing money? No  Feeding yourself? No  Getting from bed to chair? No  Climbing a flight of stairs? No  Preparing food and eating?: No  Bathing or showering? No  Getting dressed: No  Getting to the toilet? No  Using the toilet:No  Moving around from place to place: No  In the past year have you fallen or had a near fall?:No  Are you sexually active? No  Do you have more than one partner? No   Hearing Difficulties: No  Do you often ask people to speak up or repeat themselves? No  Do you experience ringing or noises in your ears? No  Do you have difficulty understanding soft or whispered voices? No  Do you feel that you have a problem with memory? No Do you often misplace items? Sometimes   Home Safety:  Do you have a smoke alarm at your residence? Yes Do you have grab bars in the bathroom? Yes Do you have throw rugs in your house? Yes   Cognitive Testing  Alert? Yes Normal Appearance?Yes  Oriented to person? Yes Place? Yes  Time? Yes  Recall of three objects? Yes  Can perform simple calculations? Yes  Displays appropriate judgment?Yes  Can read the correct time from a watch face?Yes   List the Names of Other Physician/Practitioners you currently use:  See referral list for the physicians patient is currently seeing.     Review of Systems: See above   Objective:     General appearance: Appears younger than stated age. Thin frail female. Head: Normocephalic, without obvious abnormality, atraumatic  Eyes: conj clear, EOMi PEERLA  Ears: normal TM's and external ear canals both ears  Nose: Nares normal. Septum midline. Mucosa normal. No drainage or sinus tenderness.  Throat: lips, mucosa, and tongue normal;  teeth and gums normal  Neck: no adenopathy, no carotid bruit, no JVD, supple, symmetrical, trachea midline and thyroid not enlarged, symmetric, no tenderness/mass/nodules  No CVA tenderness.  Lungs: clear to auscultation bilaterally  Breasts: normal appearance, no masses or tenderness Heart: regular rate and rhythm, S1, S2 normal, no murmur, click, rub or gallop  Abdomen: soft, non-tender; bowel sounds normal; no masses, no organomegaly  Musculoskeletal: ROM normal in all joints, no crepitus, no deformity, Normal muscle strengthen. Back  is symmetric, no curvature. Skin: Skin color, texture, turgor normal. No rashes or lesions  Lymph nodes: Cervical, supraclavicular, and axillary nodes normal.  Neurologic: CN 2 -12 Normal, Normal symmetric reflexes. Normal coordination and gait  Psych: Alert & Oriented x 3, Mood appear stable.    Assessment:    Annual wellness medicare exam   Plan:    During the course of the visit the patient was educated and counseled about appropriate screening and preventive services including:  Annual flu vaccine  Hemoccult cards      Patient Instructions (the written plan) was given to the patient.  Medicare Attestation  I have personally reviewed:  The patient's medical and social history  Their use of alcohol, tobacco or illicit drugs  Their current medications and supplements  The patient's functional ability including ADLs,fall risks, home safety risks, cognitive, and hearing and visual impairment  Diet and physical activities  Evidence for depression or mood disorders  The patient's weight, height, BMI, and visual acuity have been recorded in the chart. I have made referrals, counseling, and provided education to the patient based on review of the above and I have provided the patient with a written personalized care plan for preventive services.

## 2015-05-16 NOTE — Patient Instructions (Addendum)
Continue same medications and return in Spring 2016. It was a pleasure to see you today. Take vitamin D supplement.

## 2015-05-21 ENCOUNTER — Other Ambulatory Visit: Payer: Medicare Other | Admitting: Internal Medicine

## 2015-05-21 DIAGNOSIS — E559 Vitamin D deficiency, unspecified: Secondary | ICD-10-CM

## 2015-05-21 DIAGNOSIS — E039 Hypothyroidism, unspecified: Secondary | ICD-10-CM

## 2015-05-21 LAB — TSH: TSH: 4.141 u[IU]/mL (ref 0.350–4.500)

## 2015-05-22 ENCOUNTER — Telehealth: Payer: Self-pay | Admitting: *Deleted

## 2015-05-22 LAB — VITAMIN D 25 HYDROXY (VIT D DEFICIENCY, FRACTURES): Vit D, 25-Hydroxy: 37 ng/mL (ref 30–100)

## 2015-05-22 MED ORDER — LEVOTHYROXINE SODIUM 50 MCG PO TABS: 50.0000 ug | ORAL_TABLET | Freq: Every day | ORAL | Status: DC

## 2015-05-22 NOTE — Telephone Encounter (Signed)
Sent new Rx for levothyroxine to patient pharmacy. No answer at home phone number will try to contact patient again later with lab results and instructions

## 2015-05-23 NOTE — Telephone Encounter (Signed)
Reviewed lab results and instructions with patient. Patient verbalized understanding. 

## 2015-06-03 ENCOUNTER — Other Ambulatory Visit: Payer: Self-pay | Admitting: Internal Medicine

## 2015-06-11 ENCOUNTER — Ambulatory Visit: Payer: Medicare Other | Admitting: Internal Medicine

## 2015-09-02 ENCOUNTER — Other Ambulatory Visit: Payer: Self-pay | Admitting: Internal Medicine

## 2015-10-30 ENCOUNTER — Other Ambulatory Visit: Payer: Self-pay | Admitting: Internal Medicine

## 2015-11-21 ENCOUNTER — Encounter: Payer: Self-pay | Admitting: Internal Medicine

## 2015-11-21 ENCOUNTER — Ambulatory Visit (INDEPENDENT_AMBULATORY_CARE_PROVIDER_SITE_OTHER): Payer: Medicare Other | Admitting: Internal Medicine

## 2015-11-21 ENCOUNTER — Other Ambulatory Visit: Payer: Self-pay | Admitting: Internal Medicine

## 2015-11-21 VITALS — BP 132/78 | HR 62 | Temp 97.1°F | Resp 20 | Ht 60.0 in | Wt 130.5 lb

## 2015-11-21 DIAGNOSIS — M810 Age-related osteoporosis without current pathological fracture: Secondary | ICD-10-CM | POA: Diagnosis not present

## 2015-11-21 DIAGNOSIS — I10 Essential (primary) hypertension: Secondary | ICD-10-CM | POA: Diagnosis not present

## 2015-11-21 DIAGNOSIS — E039 Hypothyroidism, unspecified: Secondary | ICD-10-CM

## 2015-11-21 LAB — TSH: TSH: 0.91 m[IU]/L

## 2015-11-21 NOTE — Progress Notes (Signed)
   Subjective:    Patient ID: Krystal AlbaMargie Johns, female    DOB: 06/24/1923, 82 y.o.   MRN: [id number]  HPI 82 year old        Review of Systems     Objective:   Physical Exam        Assessment & Plan:

## 2015-11-21 NOTE — Patient Instructions (Signed)
It was a pleasure to see you today. TSH pending. Continue same meds and RTC in 6 months

## 2015-11-21 NOTE — Progress Notes (Signed)
   Subjective:    Patient ID: Liston AlbaMargie Melka, female    DOB: 10/02/1922, 82 y.o.   MRN: [id number]  HPI  82 year old Female widow in today for recheck on essential hypertension and hypothyroidism. She has a history of occasional urinary tract infections. No urinary symptoms today. History of osteoporosis. No recent falls. She feels well and has no complaints. Says she had  a respiratory infection a couple of months ago and did not need to have medical attention. She does not drive. Lives near relatives.    Review of Systems     Objective:   Physical Exam  Skin warm and dry. Nodes none. Chest clear to auscultation. Cardiac exam regular rate and rhythm with occasional extrasystole. Extremities without edema. No JVD thyromegaly or carotid bruits. Neck supple. Alert and oriented. Affect is normal. TSH drawn and pending      Assessment & Plan:  Essential hypertension-stable on current regimen  Hypothyroidism  Osteoporosis-no recent falls or fractures  History of recurrent urinary tract infections-no urinary symptoms today  Plan: Return in 6 months for physical exam or sooner if necessary

## 2016-04-21 ENCOUNTER — Other Ambulatory Visit: Payer: Self-pay | Admitting: Internal Medicine

## 2016-05-28 ENCOUNTER — Ambulatory Visit (INDEPENDENT_AMBULATORY_CARE_PROVIDER_SITE_OTHER): Payer: Medicare Other | Admitting: Internal Medicine

## 2016-05-28 VITALS — BP 130/80 | HR 80 | Temp 98.3°F | Ht 60.0 in | Wt 129.0 lb

## 2016-05-28 DIAGNOSIS — E559 Vitamin D deficiency, unspecified: Secondary | ICD-10-CM

## 2016-05-28 DIAGNOSIS — F411 Generalized anxiety disorder: Secondary | ICD-10-CM | POA: Diagnosis not present

## 2016-05-28 DIAGNOSIS — R8290 Unspecified abnormal findings in urine: Secondary | ICD-10-CM

## 2016-05-28 DIAGNOSIS — E785 Hyperlipidemia, unspecified: Secondary | ICD-10-CM | POA: Diagnosis not present

## 2016-05-28 DIAGNOSIS — N390 Urinary tract infection, site not specified: Secondary | ICD-10-CM | POA: Diagnosis not present

## 2016-05-28 DIAGNOSIS — Z0000 Encounter for general adult medical examination without abnormal findings: Secondary | ICD-10-CM

## 2016-05-28 DIAGNOSIS — R011 Cardiac murmur, unspecified: Secondary | ICD-10-CM

## 2016-05-28 DIAGNOSIS — I159 Secondary hypertension, unspecified: Secondary | ICD-10-CM | POA: Diagnosis not present

## 2016-05-28 DIAGNOSIS — E039 Hypothyroidism, unspecified: Secondary | ICD-10-CM | POA: Diagnosis not present

## 2016-05-28 DIAGNOSIS — Z8639 Personal history of other endocrine, nutritional and metabolic disease: Secondary | ICD-10-CM | POA: Diagnosis not present

## 2016-05-28 DIAGNOSIS — M810 Age-related osteoporosis without current pathological fracture: Secondary | ICD-10-CM

## 2016-05-28 LAB — COMPREHENSIVE METABOLIC PANEL
ALT: 8 U/L (ref 6–29)
AST: 14 U/L (ref 10–35)
Albumin: 3.8 g/dL (ref 3.6–5.1)
Alkaline Phosphatase: 59 U/L (ref 33–130)
BUN: 23 mg/dL (ref 7–25)
CHLORIDE: 105 mmol/L (ref 98–110)
CO2: 26 mmol/L (ref 20–31)
CREATININE: 0.98 mg/dL — AB (ref 0.60–0.88)
Calcium: 9 mg/dL (ref 8.6–10.4)
GLUCOSE: 91 mg/dL (ref 65–99)
Potassium: 4.3 mmol/L (ref 3.5–5.3)
SODIUM: 140 mmol/L (ref 135–146)
TOTAL PROTEIN: 6.4 g/dL (ref 6.1–8.1)
Total Bilirubin: 0.6 mg/dL (ref 0.2–1.2)

## 2016-05-28 LAB — CBC WITH DIFFERENTIAL/PLATELET
BASOS PCT: 1 %
Basophils Absolute: 59 cells/uL (ref 0–200)
EOS ABS: 59 {cells}/uL (ref 15–500)
EOS PCT: 1 %
HCT: 39.8 % (ref 35.0–45.0)
HEMOGLOBIN: 13.2 g/dL (ref 11.7–15.5)
LYMPHS ABS: 1593 {cells}/uL (ref 850–3900)
Lymphocytes Relative: 27 %
MCH: 30.7 pg (ref 27.0–33.0)
MCHC: 33.2 g/dL (ref 32.0–36.0)
MCV: 92.6 fL (ref 80.0–100.0)
MONOS PCT: 9 %
MPV: 10.7 fL (ref 7.5–12.5)
Monocytes Absolute: 531 cells/uL (ref 200–950)
NEUTROS ABS: 3658 {cells}/uL (ref 1500–7800)
Neutrophils Relative %: 62 %
PLATELETS: 277 10*3/uL (ref 140–400)
RBC: 4.3 MIL/uL (ref 3.80–5.10)
RDW: 12.9 % (ref 11.0–15.0)
WBC: 5.9 10*3/uL (ref 3.8–10.8)

## 2016-05-28 LAB — LIPID PANEL
CHOL/HDL RATIO: 4.1 ratio (ref <=5.0)
CHOLESTEROL: 200 mg/dL (ref 125–200)
HDL: 49 mg/dL (ref >=46)
LDL CALC: 124 mg/dL (ref <130)
Triglycerides: 133 mg/dL (ref <150)
VLDL: 27 mg/dL (ref <30)

## 2016-05-28 LAB — TSH: TSH: 1.56 m[IU]/L

## 2016-05-28 MED ORDER — LISINOPRIL 20 MG PO TABS: 20.0000 mg | ORAL_TABLET | Freq: Every day | ORAL | 1 refills | Status: DC

## 2016-05-28 MED ORDER — HYDROCHLOROTHIAZIDE 25 MG PO TABS: 25.0000 mg | ORAL_TABLET | Freq: Every day | ORAL | 3 refills | Status: DC

## 2016-05-28 MED ORDER — LEVOTHYROXINE SODIUM 50 MCG PO TABS: ORAL_TABLET | ORAL | 1 refills | Status: DC

## 2016-05-28 NOTE — Progress Notes (Signed)
Subjective:    Patient ID: Krystal Johns, female    DOB: 12-Dec-1922, 82 y.o.   MRN: [id number]  HPI   82 year old Female for health maintenance exam and evaluation of medical issues.Continues to live alone in rented house near family. Family is supportive. She goes to church every Sunday. Says she eats an apple every night.  She declines flu vaccine. Urinalysis is abnormal. Culture was taken. She has a history of recurrent urinary infections, hypertension, osteoporosis. No recent falls.  She's been on ACE inhibitor and HCTZ for many years for hypertension.  She has a history of asymptomatic PACs. Remote history of kidney stones status post stone basket extraction 1987. History of vitamin D deficiency. History of urinary tract infections in 2003 and 2004. History of acute diverticulitis with septicemia in May 1989.  Last colonoscopy was in 2001 and she has declined further colonoscopies.  Patient takes one baby aspirin daily.  She is apparently allergic to penicillin.  Patient had a displaced Colles' fracture of right wrist in 1996. Fractured third metatarsal left foot 1987.  Social history: She is a widow. She stated home and took care of her husband with assistance until he died of complications of Parkinson's disease January 2015. No children. She does not drive. 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  She does not smoke or consume alcohol. Had miscarriage 12.  Family history: Brother died February 23, 2009of lung cancer with history of MI. Father died at age 52 of heart failure. Mother died at age 39 with heart disease and history of hypertension. Total of 6 sisters. She has 3 living sisters one of whom has hypertension.  Long-standing history of asymptomatic cardiac murmur.  Prevnar and Pneumovax immunizations are up-to-date and she has had Zostavax vaccine in 2012.       Review of Systems    Constitutional: Negative.   All other systems reviewed and are negative.       Objective:   Physical Exam  Constitutional: She is oriented to person, place, and time. She appears well-developed and well-nourished. No distress.  HENT:  Head: Normocephalic and atraumatic.  Right Ear: External ear normal.  Left Ear: External ear normal.  Mouth/Throat: Oropharynx is clear and moist.  Eyes: Conjunctivae and EOM are normal. Pupils are equal, round, and reactive to light. Right eye exhibits no discharge. Left eye exhibits no discharge. No scleral icterus.  Neck: Neck supple. No JVD present. No thyromegaly present.  Cardiovascular: Normal rate and regular rhythm.   Murmur heard. 2/6 systolic ejection murmur unchanged  Pulmonary/Chest: Effort normal and breath sounds normal. She has no wheezes. She has no rales.  Breasts normal female without masses  Abdominal: Soft. Bowel sounds are normal. She exhibits no distension and no mass. There is no tenderness. There is no rebound and no guarding.  Genitourinary:  Genitourinary Comments: Bimanual normal. Pap deferred due to age  Musculoskeletal: She exhibits no edema.  Lymphadenopathy:    She has no cervical adenopathy.  Neurological: She is alert and oriented to person, place, and time. She has normal reflexes. No cranial nerve deficit. Coordination normal.  Skin: Skin is warm and dry. No rash noted. She is not diaphoretic.  Psychiatric: She has a normal mood and affect. Her behavior is normal. Judgment and thought content normal.  Vitals reviewed.         Assessment & Plan:  Essential hypertension  Osteoporosis--Status  post Colles' fracture right wrist 1986 and fractured third metatarsal left foot 1987  History of recurrent urinary infections-Abnormal urine dipstick. Culture sent. She is asymptomatic.  History of PACs-asymptomatic  History of systolic murmur-asymptomatic  History of anxiety  History of vitamin D  deficiency  History of kidney stones status post stone basket extraction 1987.  History of acute diverticulitis with septicemia May 1989.  Subjective:   Patient presents for Medicare Annual/Subsequent preventive examination.  Review Past Medical/Family/Social: see above   Risk Factors  Current exercise habits: sedentary Dietary issues discussed: low fat low carb  Cardiac risk factors: HTN, Family Hx  Depression Screen  (Note: if answer to either of the following is "Yes", a more complete depression screening is indicated)   Over the past two weeks, have you felt down, depressed or hopeless? No  Over the past two weeks, have you felt little interest or pleasure in doing things? No Have you lost interest or pleasure in daily life? No Do you often feel hopeless? No Do you cry easily over simple problems? No   Activities of Daily Living  In your present state of health, do you have any difficulty performing the following activities?:   Driving?  Never has driven Managing money? No  Feeding yourself? No  Getting from bed to chair? No  Climbing a flight of stairs? No  Preparing food and eating?: No  Bathing or showering? No  Getting dressed: No  Getting to the toilet? No  Using the toilet:No  Moving around from place to place: No  In the past year have you fallen or had a near fall?:No  Are you sexually active? No  Do you have more than one partner? No   Hearing Difficulties: No  Do you often ask people to speak up or repeat themselves? No  Do you experience ringing or noises in your ears? No  Do you have difficulty understanding soft or whispered voices? No  Do you feel that you have a problem with memory? No Do you often misplace items? No    Home Safety:  Do you have a smoke alarm at your residence? Yes Do you have grab bars in the bathroom? yes Do you have throw rugs in your house? no   Cognitive Testing  Alert? Yes Normal Appearance?Yes  Oriented to person?  Yes Place? Yes  Time? Yes  Recall of three objects? Yes  Can perform simple calculations? Yes  Displays appropriate judgment?Yes  Can read the correct time from a watch face?Yes   List the Names of Other Physician/Practitioners you currently use:  See referral list for the physicians patient is currently seeing.     Review of Systems: See above   Objective:     General appearance: Appears younger than stated age Head: Normocephalic, without obvious abnormality, atraumatic  Eyes: conj clear, EOMi PEERLA  Ears: normal TM's and external ear canals both ears  Nose: Nares normal. Septum midline. Mucosa normal. No drainage or sinus tenderness.  Throat: lips, mucosa, and tongue normal; teeth and gums normal  Neck: no adenopathy, no carotid bruit, no JVD, supple, symmetrical, trachea midline and thyroid not enlarged, symmetric, no tenderness/mass/nodules  No CVA tenderness.  Lungs: clear to auscultation bilaterally  Breasts: normal appearance, no masses or tenderness, top of the pacemaker on left upper chest. Incision well-healed. It is tender.  Heart: regular rate and rhythm, S1, S2 normal,  Murmur present, click, rub or gallop  Abdomen: soft, non-tender; bowel sounds normal; no masses, no  organomegaly  Musculoskeletal: ROM normal in all joints, no crepitus, no deformity, Normal muscle strengthen. Back  is symmetric, no curvature. Skin: Skin color, texture, turgor normal. No rashes or lesions  Lymph nodes: Cervical, supraclavicular, and axillary nodes normal.  Neurologic: CN 2 -12 Normal, Normal symmetric reflexes. Normal coordination and gait  Psych: Alert & Oriented x 3, Mood appear stable.    Assessment:    Annual wellness medicare exam   Plan:    During the course of the visit the patient was educated and counseled about appropriate screening and preventive services including:  Recommend annual mammogram  Patient declines flu vaccine      Patient Instructions (the  written plan) was given to the patient.  Medicare Attestation  I have personally reviewed:  The patient's medical and social history  Their use of alcohol, tobacco or illicit drugs  Their current medications and supplements  The patient's functional ability including ADLs,fall risks, home safety risks, cognitive, and hearing and visual impairment  Diet and physical activities  Evidence for depression or mood disorders  The patient's weight, height, BMI, and visual acuity have been recorded in the chart. I have made referrals, counseling, and provided education to the patient based on review of the above and I have provided the patient with a written personalized care plan for preventive services.

## 2016-05-29 LAB — POC URINALSYSI DIPSTICK (AUTOMATED)
BILIRUBIN UA: NEGATIVE
Blood, UA: NEGATIVE
GLUCOSE UA: NEGATIVE
NITRITE UA: NEGATIVE
PH UA: 6
Protein, UA: NEGATIVE
Spec Grav, UA: 1.01
Urobilinogen, UA: NEGATIVE

## 2016-05-29 LAB — HEMOCCULT GUIAC POC 1CARD (OFFICE): FECAL OCCULT BLD: NEGATIVE

## 2016-05-29 LAB — VITAMIN D 25 HYDROXY (VIT D DEFICIENCY, FRACTURES): VIT D 25 HYDROXY: 35 ng/mL (ref 30–100)

## 2016-05-30 ENCOUNTER — Other Ambulatory Visit: Payer: Self-pay | Admitting: Internal Medicine

## 2016-05-30 LAB — URINE CULTURE

## 2016-06-01 ENCOUNTER — Ambulatory Visit: Payer: Self-pay | Admitting: Internal Medicine

## 2016-06-01 ENCOUNTER — Other Ambulatory Visit: Payer: Self-pay | Admitting: *Deleted

## 2016-06-01 MED ORDER — CIPROFLOXACIN HCL 250 MG PO TABS: 250.0000 mg | ORAL_TABLET | Freq: Two times a day (BID) | ORAL | 0 refills | Status: AC

## 2016-06-08 ENCOUNTER — Encounter: Payer: Self-pay | Admitting: Internal Medicine

## 2016-06-08 NOTE — Patient Instructions (Signed)
Urine culture pending. Flu vaccine declined. RTC one year or as needed.

## 2016-06-09 ENCOUNTER — Encounter: Payer: Self-pay | Admitting: Internal Medicine

## 2016-06-09 ENCOUNTER — Ambulatory Visit (INDEPENDENT_AMBULATORY_CARE_PROVIDER_SITE_OTHER): Payer: Medicare Other | Admitting: Internal Medicine

## 2016-06-09 VITALS — BP 134/86 | Temp 97.5°F

## 2016-06-09 DIAGNOSIS — Z87440 Personal history of urinary (tract) infections: Secondary | ICD-10-CM | POA: Diagnosis not present

## 2016-06-09 LAB — POCT URINALYSIS DIPSTICK
BILIRUBIN UA: NEGATIVE
Blood, UA: NEGATIVE
GLUCOSE UA: NEGATIVE
KETONES UA: NEGATIVE
Leukocytes, UA: NEGATIVE
Nitrite, UA: NEGATIVE
Protein, UA: NEGATIVE
SPEC GRAV UA: 1.015
UROBILINOGEN UA: NEGATIVE
pH, UA: 6

## 2016-06-09 NOTE — Progress Notes (Signed)
UTI recheck, Pt states feeling better.

## 2016-11-06 ENCOUNTER — Ambulatory Visit (HOSPITAL_COMMUNITY)
Admission: EM | Admit: 2016-11-06 | Discharge: 2016-11-06 | Disposition: A | Discharge status: Home / Self Care | Payer: Medicare Other | Attending: Internal Medicine | Admitting: Internal Medicine

## 2016-11-06 ENCOUNTER — Ambulatory Visit (INDEPENDENT_AMBULATORY_CARE_PROVIDER_SITE_OTHER): Payer: Medicare Other

## 2016-11-06 ENCOUNTER — Encounter (HOSPITAL_COMMUNITY): Payer: Self-pay

## 2016-11-06 DIAGNOSIS — S22000A Wedge compression fracture of unspecified thoracic vertebra, initial encounter for closed fracture: Secondary | ICD-10-CM | POA: Diagnosis not present

## 2016-11-06 DIAGNOSIS — S52023B Displaced fracture of olecranon process without intraarticular extension of unspecified ulna, initial encounter for open fracture type I or II: Secondary | ICD-10-CM

## 2016-11-06 DIAGNOSIS — S52021B Displaced fracture of olecranon process without intraarticular extension of right ulna, initial encounter for open fracture type I or II: Secondary | ICD-10-CM

## 2016-11-06 DIAGNOSIS — S52021A Displaced fracture of olecranon process without intraarticular extension of right ulna, initial encounter for closed fracture: Secondary | ICD-10-CM | POA: Diagnosis not present

## 2016-11-06 DIAGNOSIS — W19XXXA Unspecified fall, initial encounter: Secondary | ICD-10-CM

## 2016-11-06 DIAGNOSIS — W010XXA Fall on same level from slipping, tripping and stumbling without subsequent striking against object, initial encounter: Secondary | ICD-10-CM

## 2016-11-06 DIAGNOSIS — Z23 Encounter for immunization: Secondary | ICD-10-CM | POA: Diagnosis not present

## 2016-11-06 DIAGNOSIS — M546 Pain in thoracic spine: Secondary | ICD-10-CM | POA: Diagnosis not present

## 2016-11-06 MED ORDER — TETANUS-DIPHTH-ACELL PERTUSSIS 5-2.5-18.5 LF-MCG/0.5 IM SUSP
0.5000 mL | Freq: Once | INTRAMUSCULAR | Status: AC
  Administered 2016-11-06: 0.5 mL via INTRAMUSCULAR

## 2016-11-06 MED ORDER — ACETAMINOPHEN 325 MG PO TABS
ORAL_TABLET | ORAL | Status: AC
  Filled 2016-11-06: qty 2

## 2016-11-06 MED ORDER — HYDROCODONE-ACETAMINOPHEN 5-325 MG PO TABS: 2.0000 | ORAL_TABLET | ORAL | 0 refills | Status: DC | PRN

## 2016-11-06 MED ORDER — CEPHALEXIN 500 MG PO CAPS: 500.0000 mg | ORAL_CAPSULE | Freq: Two times a day (BID) | ORAL | 0 refills | Status: DC

## 2016-11-06 MED ORDER — TETANUS-DIPHTH-ACELL PERTUSSIS 5-2.5-18.5 LF-MCG/0.5 IM SUSP
INTRAMUSCULAR | Status: AC
  Filled 2016-11-06: qty 0.5

## 2016-11-06 MED ORDER — IBUPROFEN 800 MG PO TABS
ORAL_TABLET | ORAL | Status: AC
  Filled 2016-11-06: qty 1

## 2016-11-06 MED ORDER — ACETAMINOPHEN 325 MG PO TABS
650.0000 mg | ORAL_TABLET | Freq: Once | ORAL | Status: AC
  Administered 2016-11-06: 650 mg via ORAL

## 2016-11-06 MED ORDER — TETANUS-DIPHTHERIA TOXOIDS TD 5-2 LFU IM INJ: 0.5000 mL | INJECTION | Freq: Once | INTRAMUSCULAR | Status: DC

## 2016-11-06 MED ORDER — IBUPROFEN 800 MG PO TABS
400.0000 mg | ORAL_TABLET | Freq: Once | ORAL | Status: AC
  Administered 2016-11-06: 400 mg via ORAL

## 2016-11-06 NOTE — ED Provider Notes (Signed)
[facility]    CSN: [id number] Arrival date & time: 11/06/16  1829     History   Chief Complaint Chief Complaint  Patient presents with  . Fall    HPI Krystal Johns is a 83 y.o. female. She presents today after a fall; she had been standing at her counter for a prolonged period of time and then could not stand up any more, fell forward.  Fall resulted in severe pain and skin tear to posterior right elbow.  Some discomfort in lower sternum and at approx T12.  No loss of consciousness.  Did not hit head; no neck pain.  HPI  Past Medical History:  Diagnosis Date  . Asymptomatic PVCs   . Diverticulitis of colon   . Hypertension   . Osteoporosis   . PAC (premature atrial contraction)    asymptomatic  . Vitamin D deficiency     Patient Active Problem List   Diagnosis Date Noted  . Systolic murmur 11/22/2014  . Hypothyroidism 11/22/2014  . Hypertension 02/14/2011  . Osteoporosis 02/14/2011  . Vitamin D deficiency 02/14/2011    Past Surgical History:  Procedure Laterality Date  . STONE EXTRACTION WITH BASKET         Home Medications    Prior to Admission medications   Medication Sig Start Date End Date Taking? Authorizing Provider  hydrochlorothiazide (HYDRODIURIL) 25 MG tablet Take 1 tablet (25 mg total) by mouth daily. 05/28/16  Yes Margaree Mackintosh, MD  levothyroxine (SYNTHROID, LEVOTHROID) 50 MCG tablet TAKE 1 TABLET (50 MCG TOTAL) BY MOUTH DAILY. 05/28/16  Yes Margaree Mackintosh, MD  lisinopril (PRINIVIL,ZESTRIL) 20 MG tablet Take 1 tablet (20 mg total) by mouth daily. 05/28/16  Yes Margaree Mackintosh, MD  ALPRAZolam Prudy Feeler) 0.25 MG tablet TAKE 1 TABLET BY MOUTH 2 TIMES A DAY AS NEEDED. 04/26/15   Margaree Mackintosh, MD  cephALEXin (KEFLEX) 500 MG capsule Take 1 capsule (500 mg total) by mouth 2 (two) times daily. 11/06/16   Eustace Moore, MD  HYDROcodone-acetaminophen (NORCO/VICODIN) 5-325 MG tablet Take 2 tablets by mouth every 4 (four) hours as needed. 11/06/16    Eustace Moore, MD  triamcinolone cream (KENALOG) 0.1 % Apply 1 application topically 3 (three) times daily. 11/22/14   Margaree Mackintosh, MD    Family History Family History  Problem Relation Age of Onset  . Heart disease Mother   . Hypertension Mother   . Heart disease Father   . Cancer Brother     Social History Social History  Substance Use Topics  . Smoking status: Never Smoker  . Smokeless tobacco: Never Used  . Alcohol use No     Allergies   Penicillins   Review of Systems Review of Systems  All other systems reviewed and are negative.    Physical Exam Triage Vital Signs ED Triage Vitals  Enc Vitals Group     BP 11/06/16 1908 (!) 85/52     Pulse Rate 11/06/16 1908 76     Resp 11/06/16 1908 20     Temp 11/06/16 1908 (!) 96.8 F (36 C)     Temp Source 11/06/16 1908 Temporal     SpO2 11/06/16 1908 98 %     Weight --      Height --      Pain Score 11/06/16 1909 10     Pain Loc --    Updated Vital Signs BP (!) 92/53   Pulse 79   Temp (!) 96.8  F (36 C) (Temporal)   Resp 20   SpO2 98%   Physical Exam  Constitutional: She is oriented to person, place, and time.  Alert, nicely groomed Patient is pale, in pain In pushchair  HENT:  Head: Atraumatic.  Eyes:  Conjugate gaze, no eye redness/drainage  Neck: Neck supple.  Cardiovascular: Normal rate and regular rhythm.   Pulmonary/Chest: No respiratory distress. She has no wheezes. She has no rales.  Lungs clear, symmetric breath sounds  Abdominal: She exhibits no distension.  Musculoskeletal:  No leg swelling Mild tenderness to percussion about T12 (midline) Right posterior elbow bruised/swollen skin superficial skin tear, flap type, approx 2-3" long.  Patient holding arm in partial extension, about 120 degrees, and resists wrist rotation.  No focal tenderness at shoulder/prox humerus.  No focal tenderness/swelling at wrist/hand.    Neurological: She is alert and oriented to person, place, and time.    Skin: Skin is warm and dry.  No cyanosis  Nursing note and vitals reviewed.    UC Treatments / Results   Radiology Dg Thoracic Spine 2 View  Result Date: 11/06/2016 CLINICAL DATA:  Status post fall, with upper back pain. Initial encounter. EXAM: THORACIC SPINE 2 VIEWS COMPARISON:  Chest radiograph performed 12/06/2014 FINDINGS: There is chronic compression deformity of vertebral body T12. No definite acute fractures are seen. Vertebral bodies demonstrate normal alignment. Intervertebral disc spaces are preserved. The visualized portions of both lungs are grossly clear. The mediastinum is unremarkable in appearance. IMPRESSION: No evidence of acute fracture or subluxation along the thoracic spine. Chronic compression deformity of vertebral body T12. Electronically Signed   By: Roanna RaiderJeffery  Chang M.D.   On: 11/06/2016 20:50   Dg Elbow Complete Right  Result Date: 11/06/2016 CLINICAL DATA:  Right elbow pain after fall today. EXAM: RIGHT ELBOW - COMPLETE 3+ VIEW COMPARISON:  None. FINDINGS: Severely displaced oblique fracture is seen involving the olecranon. The fracture extends into the proximal ulna shaft. The visualized humerus and radius appear normal. Soft tissue swelling is noted. IMPRESSION: Severely displaced fracture involving the olecranon and proximal ulnar shaft. Electronically Signed   By: Lupita RaiderJames  Green Jr, M.D.   On: 11/06/2016 20:50    Procedures Procedures (including critical care time) Posterior long arm splint applied by ortho tech Spoke to Dr Magnus IvanBlackman (Orthopedics); patient should call Monday for followup early next week   Medications Ordered in UC Medications  acetaminophen (TYLENOL) tablet 650 mg (650 mg Oral Given 11/06/16 2012)  ibuprofen (ADVIL,MOTRIN) tablet 400 mg (400 mg Oral Given 11/06/16 2200)  Tdap (BOOSTRIX) injection 0.5 mL (0.5 mLs Intramuscular Given 11/06/16 2201)      Final Clinical Impressions(s) / UC Diagnoses   Final diagnoses:  Ulna, olecranon  process fracture, right, open type I or II, initial encounter  Fall, initial encounter  Closed compression fracture of thoracic vertebra, initial encounter (HCC)   Tylenol and aleve or ibuprofen will help with pain.  Prescription for a small number of vicodin given.  Ice for 10-15 minutes several times daily may help with pain.  Prescription for an antibiotic, keflex, was sent to the pharmacy.  Please start this tomorrow morning.  Recheck for uncontrolled pain, or if hand/fingers are swollen.  Compression fracture seen on xray tonight is of uncertain age.    New Prescriptions Discharge Medication List as of 11/06/2016 10:05 PM    START taking these medications   Details  cephALEXin (KEFLEX) 500 MG capsule Take 1 capsule (500 mg total) by mouth 2 (two) times  daily., Starting Fri 11/06/2016, Print    HYDROcodone-acetaminophen (NORCO/VICODIN) 5-325 MG tablet Take 2 tablets by mouth every 4 (four) hours as needed., Starting Fri 11/06/2016, Print         Eustace Moore, MD 11/08/16 1210

## 2016-11-06 NOTE — ED Notes (Signed)
ED Provider at bedside. 

## 2016-11-06 NOTE — ED Notes (Signed)
Ortho tech at bedside 

## 2016-11-06 NOTE — Discharge Instructions (Addendum)
Tylenol and aleve or ibuprofen will help with pain.  Prescription for a small number of vicodin given.  Ice for 10-15 minutes several times daily may help with pain.  Prescription for an antibiotic, keflex, was sent to the pharmacy.  Please start this tomorrow morning.  Recheck for uncontrolled pain, or if hand/fingers are swollen.  Compression fracture seen on xray tonight is of uncertain age.

## 2016-11-06 NOTE — ED Triage Notes (Signed)
Pt fell today in the kitchen at home and went down. Said she fell face forward and now having generalized pain. Mostly her right arm/elbow.

## 2016-11-06 NOTE — Progress Notes (Signed)
Orthopedic Tech Progress Note Patient Details:  Liston AlbaMargie Conger 09/25/1922 [id number]  Ortho Devices Type of Ortho Device: Ace wrap, Arm sling, Post (long arm) splint Ortho Device/Splint Location: RUE Ortho Device/Splint Interventions: Ordered, Application   Jennye MoccasinHughes, Mansoor Hillyard Craig 11/06/2016, 9:58 PM

## 2016-11-09 ENCOUNTER — Encounter (HOSPITAL_COMMUNITY): Payer: Self-pay | Admitting: Emergency Medicine

## 2016-11-09 ENCOUNTER — Ambulatory Visit (HOSPITAL_COMMUNITY): Admission: EM | Admit: 2016-11-09 | Discharge: 2016-11-09 | Payer: Medicare Other

## 2016-11-09 ENCOUNTER — Encounter (INDEPENDENT_AMBULATORY_CARE_PROVIDER_SITE_OTHER): Payer: Self-pay | Admitting: Orthopedic Surgery

## 2016-11-09 ENCOUNTER — Ambulatory Visit (INDEPENDENT_AMBULATORY_CARE_PROVIDER_SITE_OTHER): Payer: Medicare Other | Admitting: Orthopedic Surgery

## 2016-11-09 DIAGNOSIS — S52021A Displaced fracture of olecranon process without intraarticular extension of right ulna, initial encounter for closed fracture: Secondary | ICD-10-CM | POA: Diagnosis not present

## 2016-11-09 MED ORDER — HYDROCODONE-ACETAMINOPHEN 5-325 MG PO TABS: 1.0000 | ORAL_TABLET | Freq: Four times a day (QID) | ORAL | 0 refills | Status: DC | PRN

## 2016-11-09 NOTE — ED Triage Notes (Signed)
[facility] a [facility] of bleeding through an orthoglass cast that was placed on 11/06/2016. The patient stated that she noticed blood 3 days ago that she felt was bleeding through the casting.

## 2016-11-09 NOTE — Progress Notes (Signed)
   Office Visit Note   Patient: Krystal Johns           Date of Birth: 01/26/1923           MRN: [id number] Visit Date: 11/09/2016              Requested by: Margaree MackintoshMary J Baxley, MD [address] [place] [id number] PCP: Margaree MackintoshMary J Baxley, MD  Chief Complaint  Patient presents with  . Right Elbow - Fracture    HPI: Patient is a 83 year old woman who was standing on a counter and fell forward on 11/06/2016. She sustained a displaced fracture of the right olecranon with skin tearing. Patient had bleeding through the dressing went back to urgent care and was referred to our office today urgently. She is currently on Keflex 500 mg twice a day and Vicodin for pain.  Assessment & Plan: Visit Diagnoses:  1. Olecranon fracture, right, closed, initial encounter     Plan: Patient will have Xeroform applied to the wound a dry dressing plus a splint with 20 of flexion. Patient has significant soft tissue shear injury and is not a candidate for internal fixation. Follow-up in 1 week for dressing change.  Follow-Up Instructions: Return in about 1 week (around 11/16/2016).   Ortho Exam  Patient is alert, oriented, no adenopathy, well-dressed, normal affect, normal respiratory effort. Patient does have an antalgic gait examination patient has ecchymosis and bruising involving area approximately 20 cm in length and 10 cm in width the skin is ecchymotic bruise ferritin with tearing of the skin. Patient has an extremely friable soft tissue envelope. There is no open fracture.  Imaging: No results found.  Labs: Lab Results  Component Value Date   LABORGA ESCHERICHIA COLI 06/02/2016    Orders:  No orders of the defined types were placed in this encounter.  Meds ordered this encounter  Medications  . HYDROcodone-acetaminophen (NORCO/VICODIN) 5-325 MG tablet    Sig: Take 1 tablet by mouth every 6 (six) hours as needed for moderate pain.    Dispense:  30 tablet    Refill:  0     Procedures: No procedures performed  Clinical Data: No additional findings.  ROS: Review of Systems  Objective: Vital Signs: There were no vitals taken for this visit.  Specialty Comments:  No specialty comments available.  PMFS History: Patient Active Problem List   Diagnosis Date Noted  . Olecranon fracture, right, closed, initial encounter 11/09/2016  . Systolic murmur 11/22/2014  . Hypothyroidism 11/22/2014  . Hypertension 02/14/2011  . Osteoporosis 02/14/2011  . Vitamin D deficiency 02/14/2011   Past Medical History:  Diagnosis Date  . Asymptomatic PVCs   . Diverticulitis of colon   . Hypertension   . Osteoporosis   . PAC (premature atrial contraction)    asymptomatic  . Vitamin D deficiency     Family History  Problem Relation Age of Onset  . Heart disease Mother   . Hypertension Mother   . Heart disease Father   . Cancer Brother     Past Surgical History:  Procedure Laterality Date  . STONE EXTRACTION WITH BASKET     Social History   Occupational History  . Not on file.   Social History Main Topics  . Smoking status: Never Smoker  . Smokeless tobacco: Never Used  . Alcohol use No  . Drug use: No  . Sexual activity: Not on file

## 2016-11-09 NOTE — Progress Notes (Signed)
Thanks for seeing her

## 2016-11-09 NOTE — ED Notes (Signed)
Spoke    With  Dois DavenportSandra   At  Dr   Roda Shuttersxu  Office   seend  To  Office  Today  At  2p   To  See   Dr  duda  Left   With   Daughter  Who  Will  Take  Her

## 2016-11-10 ENCOUNTER — Inpatient Hospital Stay (INDEPENDENT_AMBULATORY_CARE_PROVIDER_SITE_OTHER): Payer: Medicare Other | Admitting: Orthopaedic Surgery

## 2016-11-18 ENCOUNTER — Encounter (INDEPENDENT_AMBULATORY_CARE_PROVIDER_SITE_OTHER): Payer: Self-pay | Admitting: Orthopedic Surgery

## 2016-11-18 ENCOUNTER — Ambulatory Visit (INDEPENDENT_AMBULATORY_CARE_PROVIDER_SITE_OTHER): Payer: Medicare Other | Admitting: Orthopedic Surgery

## 2016-11-18 DIAGNOSIS — S52021A Displaced fracture of olecranon process without intraarticular extension of right ulna, initial encounter for closed fracture: Secondary | ICD-10-CM | POA: Diagnosis not present

## 2016-11-18 NOTE — Progress Notes (Signed)
   Office Visit Note   Patient: Krystal Johns           Date of Birth: September 15, 1922           MRN: [id number] Visit Date: 11/18/2016              Requested by: Margaree Mackintosh, MD [address] [place], [place] [address] PCP: Margaree Mackintosh, MD  No chief complaint on file.     HPI: Patient presents 1 week status post right olecranon fracture. She has been using dry dressing for the skin tear over the olecranon fracture the fracture is closed. Patient has been in a posterior splint with the elbow flexed approximately 30.  Assessment & Plan: Visit Diagnoses:  1. Olecranon fracture, right, closed, initial encounter     Plan: We'll have her continue the splint she may remove the splint for bathing. Wear the splint at all times she may discontinue the sling for comfort. Repeat 2 view radiographs of the right elbow Follow-up.  Follow-Up Instructions: Return in about 3 weeks (around 12/09/2016).   Ortho Exam  Patient is alert, oriented, no adenopathy, well-dressed, normal affect, normal respiratory effort. On examination the skin tear has healed there is no redness no cellulitis no drainage no signs of infection. The fracture site is mobile with the elbow at about 30 of flexion the fracture edges are well approximated.  Imaging: No results found.  Labs: Lab Results  Component Value Date   LABORGA ESCHERICHIA COLI 06/02/2016    Orders:  No orders of the defined types were placed in this encounter.  No orders of the defined types were placed in this encounter.    Procedures: No procedures performed  Clinical Data: No additional findings.  ROS:  All other systems negative, except as noted in the HPI. Review of Systems  Objective: Vital Signs: There were no vitals taken for this visit.  Specialty Comments:  No specialty comments available.  PMFS History: Patient Active Problem List   Diagnosis Date Noted  . Olecranon fracture, right, closed, initial encounter  11/09/2016  . Systolic murmur 11/22/2014  . Hypothyroidism 11/22/2014  . Hypertension 02/14/2011  . Osteoporosis 02/14/2011  . Vitamin D deficiency 02/14/2011   Past Medical History:  Diagnosis Date  . Asymptomatic PVCs   . Diverticulitis of colon   . Hypertension   . Osteoporosis   . PAC (premature atrial contraction)    asymptomatic  . Vitamin D deficiency     Family History  Problem Relation Age of Onset  . Heart disease Mother   . Hypertension Mother   . Heart disease Father   . Cancer Brother     Past Surgical History:  Procedure Laterality Date  . STONE EXTRACTION WITH BASKET     Social History   Occupational History  . Not on file.   Social History Main Topics  . Smoking status: Never Smoker  . Smokeless tobacco: Never Used  . Alcohol use No  . Drug use: No  . Sexual activity: Not on file

## 2016-12-14 ENCOUNTER — Encounter (INDEPENDENT_AMBULATORY_CARE_PROVIDER_SITE_OTHER): Payer: Self-pay | Admitting: Orthopedic Surgery

## 2016-12-14 ENCOUNTER — Ambulatory Visit (INDEPENDENT_AMBULATORY_CARE_PROVIDER_SITE_OTHER): Payer: Medicare Other | Admitting: Orthopedic Surgery

## 2016-12-14 ENCOUNTER — Other Ambulatory Visit: Payer: Self-pay | Admitting: Internal Medicine

## 2016-12-14 ENCOUNTER — Ambulatory Visit (INDEPENDENT_AMBULATORY_CARE_PROVIDER_SITE_OTHER): Payer: Medicare Other

## 2016-12-14 VITALS — Ht 60.0 in | Wt 129.0 lb

## 2016-12-14 DIAGNOSIS — M25521 Pain in right elbow: Secondary | ICD-10-CM

## 2016-12-14 DIAGNOSIS — S52021A Displaced fracture of olecranon process without intraarticular extension of right ulna, initial encounter for closed fracture: Secondary | ICD-10-CM | POA: Diagnosis not present

## 2016-12-14 NOTE — Progress Notes (Signed)
   Office Visit Note   Patient: Krystal Johns           Date of Birth: 11-26-1922           MRN: [id number] Visit Date: 12/14/2016              Requested by: Margaree Mackintosh, MD [address] [place], [place] [address] PCP: Margaree Mackintosh, MD  Chief Complaint  Patient presents with  . Right Elbow - Follow-up, Fracture      HPI: Patient presents in follow-up status post closed nondisplaced right elbow olecranon fracture. Patient states she does feel some soreness denies any pain.  Assessment & Plan: Visit Diagnoses:  1. Pain in right elbow   2. Olecranon fracture, right, closed, initial encounter     Plan: We will have her discontinue the splint. Patient and her daughter were given instructions to work daily on elbow flexion and extension as well as supination and pronation.  Follow-Up Instructions: Return if symptoms worsen or fail to improve.   Ortho Exam  Patient is alert, oriented, no adenopathy, well-dressed, normal affect, normal respiratory effort. Examination patient's elbow was nontender to palpation. She has active extension and flexion. She has supination of 30 pronation of 45 she lacks about 30 to full extension and has flexion to 90.  Imaging: Xr Elbow 2 Views Right  Result Date: 12/14/2016 Two-view radiographs of the right elbow shows a congruent joint with excellent healing of the olecranon fracture no complicating features no displacement   Labs: Lab Results  Component Value Date   LABORGA ESCHERICHIA COLI 06/02/2016    Orders:  Orders Placed This Encounter  Procedures  . XR Elbow 2 Views Right   No orders of the defined types were placed in this encounter.    Procedures: No procedures performed  Clinical Data: No additional findings.  ROS:  All other systems negative, except as noted in the HPI. Review of Systems  Objective: Vital Signs: Ht 5' (1.524 m)   Wt 129 lb (58.5 kg)   BMI 25.19 kg/m   Specialty Comments:  No  specialty comments available.  PMFS History: Patient Active Problem List   Diagnosis Date Noted  . Olecranon fracture, right, closed, initial encounter 11/09/2016  . Systolic murmur 11/22/2014  . Hypothyroidism 11/22/2014  . Hypertension 02/14/2011  . Osteoporosis 02/14/2011  . Vitamin D deficiency 02/14/2011   Past Medical History:  Diagnosis Date  . Asymptomatic PVCs   . Diverticulitis of colon   . Hypertension   . Osteoporosis   . PAC (premature atrial contraction)    asymptomatic  . Vitamin D deficiency     Family History  Problem Relation Age of Onset  . Heart disease Mother   . Hypertension Mother   . Heart disease Father   . Cancer Brother     Past Surgical History:  Procedure Laterality Date  . STONE EXTRACTION WITH BASKET     Social History   Occupational History  . Not on file.   Social History Main Topics  . Smoking status: Never Smoker  . Smokeless tobacco: Never Used  . Alcohol use No  . Drug use: No  . Sexual activity: Not on file

## 2017-02-24 ENCOUNTER — Ambulatory Visit (INDEPENDENT_AMBULATORY_CARE_PROVIDER_SITE_OTHER): Payer: Medicare Other | Admitting: Adult Health

## 2017-02-24 ENCOUNTER — Encounter: Payer: Self-pay | Admitting: Adult Health

## 2017-02-24 DIAGNOSIS — I159 Secondary hypertension, unspecified: Secondary | ICD-10-CM

## 2017-02-24 DIAGNOSIS — E039 Hypothyroidism, unspecified: Secondary | ICD-10-CM | POA: Diagnosis not present

## 2017-02-24 MED ORDER — HYDROCHLOROTHIAZIDE 25 MG PO TABS
25.0000 mg | ORAL_TABLET | Freq: Every day | ORAL | 3 refills | Status: DC
Start: 2017-02-24 — End: 2018-02-28

## 2017-02-24 NOTE — Assessment & Plan Note (Signed)
Last TSH 1.560, on Levothyroxine 50mcg daily. We re-check TSH Oct 2018 at annual CPE

## 2017-02-24 NOTE — Progress Notes (Signed)
Subjective:    Patient ID: Krystal Johns, female    DOB: May 24, 1923, 83 y.o.   MRN: [id number]  HPI :  [name] presents to establish as a new pt.  She is a very pleasant, AMAZING 83 year old female.  PMH:  HTN, Hypothyroidism, and heart murmur.  She denies personal hx of MI, CVA, T2D, however does have family hx + for cardiac disease and T2D.  She has recent fall March 2018 and suffered R olecranon closed fx that did not require surgical intervention.  She is compliant on medication, denies SE and generally feels quite well, stating "I just keep going".  She lives alone and is able to perform all her ADLs and IADLs and has multiple family members that live in close proximity (one sister at [facility] during encounter). She reports stretching daily and eating and heart healthy diet.  Patient Care Team    Relationship Specialty Notifications Start End  Thomasene Lot, DO PCP - General Family Medicine  02/24/17   Nadara Mustard, MD Consulting Physician Orthopedic Surgery  02/24/17     Patient Active Problem List   Diagnosis Date Noted  . Olecranon fracture, right, closed, initial encounter 11/09/2016  . Systolic murmur 11/22/2014  . Hypothyroidism 11/22/2014  . Hypertension 02/14/2011  . Osteoporosis 02/14/2011  . Vitamin D deficiency 02/14/2011     Past Medical History:  Diagnosis Date  . Asymptomatic PVCs   . Diverticulitis of colon   . Hypertension   . Osteoporosis   . PAC (premature atrial contraction)    asymptomatic  . Thyroid disease   . Vitamin D deficiency      Past Surgical History:  Procedure Laterality Date  . STONE EXTRACTION WITH BASKET       Family History  Problem Relation Age of Onset  . Heart disease Mother   . Hypertension Mother   . Heart disease Father   . Cancer Brother      History  Drug Use No     History  Alcohol Use No     History  Smoking Status  . Never Smoker  Smokeless Tobacco  . Never Used     Outpatient Encounter  Prescriptions as of 02/24/2017  Medication Sig  . Cholecalciferol (VITAMIN D3) 2000 units TABS Take by mouth daily.  . hydrochlorothiazide (HYDRODIURIL) 25 MG tablet Take 1 tablet (25 mg total) by mouth daily.  Marland Kitchen levothyroxine (SYNTHROID, LEVOTHROID) 50 MCG tablet TAKE 1 TABLET (50 MCG TOTAL) BY MOUTH DAILY.  Marland Kitchen lisinopril (PRINIVIL,ZESTRIL) 20 MG tablet Take 1 tablet (20 mg total) by mouth daily.  . [DISCONTINUED] hydrochlorothiazide (HYDRODIURIL) 25 MG tablet Take 1 tablet (25 mg total) by mouth daily.  . [DISCONTINUED] triamcinolone cream (KENALOG) 0.1 % Apply 1 application topically 3 (three) times daily.  . [DISCONTINUED] ALPRAZolam (XANAX) 0.25 MG tablet TAKE 1 TABLET BY MOUTH 2 TIMES A DAY AS NEEDED. (Patient not taking: Reported on 02/24/2017)   No facility-administered encounter medications on file as of 02/24/2017.     Allergies: Penicillins  Body mass index is 23.41 kg/m.  Blood pressure (!) 147/82, pulse 62, height 4\' 10"  (1.473 m), weight 112 lb (50.8 kg).    Review of Systems  Constitutional: Negative for activity change, appetite change, chills, diaphoresis, fever and unexpected weight change.  HENT: Negative for congestion.   Eyes: Negative for visual disturbance.  Respiratory: Negative for cough, chest tightness, shortness of breath, wheezing and stridor.   Cardiovascular: Positive for leg swelling. Negative for chest  pain and palpitations.  Gastrointestinal: Negative for abdominal distention, abdominal pain, constipation, diarrhea, nausea and vomiting.  Endocrine: Negative for cold intolerance, heat intolerance, polydipsia, polyphagia and polyuria.  Genitourinary: Negative for difficulty urinating, flank pain and hematuria.  Musculoskeletal: Positive for arthralgias, joint swelling and myalgias. Negative for back pain, gait problem, neck pain and neck stiffness.  Skin: Negative for color change, pallor, rash and wound.  Allergic/Immunologic: Negative for  immunocompromised state.  Neurological: Negative for dizziness, tremors, weakness and headaches.  Hematological: Does not bruise/bleed easily.  Psychiatric/Behavioral: Negative for confusion, dysphoric mood, self-injury, sleep disturbance and suicidal ideas. The patient is not nervous/anxious and is not hyperactive.        Objective:   Physical Exam  Constitutional: She is oriented to person, place, and time. She appears well-developed and well-nourished. No distress.  HENT:  Head: Normocephalic and atraumatic.  Right Ear: External ear normal. Decreased hearing is noted.  Left Ear: External ear normal. Decreased hearing is noted.  Nose: Right sinus exhibits no maxillary sinus tenderness and no frontal sinus tenderness. Left sinus exhibits no maxillary sinus tenderness and no frontal sinus tenderness.  Mouth/Throat: Uvula is midline. Abnormal dentition.  Missing a few tooth, yet no active tooth decay noted.  Cardiovascular: Normal rate, regular rhythm and intact distal pulses.   Murmur heard. Pulmonary/Chest: Effort normal and breath sounds normal. No respiratory distress. She has no wheezes. She has no rales. She exhibits no tenderness.  Musculoskeletal: She exhibits edema and deformity. She exhibits no tenderness.       Right ankle: She exhibits swelling. She exhibits normal range of motion and normal pulse.       Left ankle: She exhibits normal range of motion, no swelling and normal pulse.  Thoracic scoliosis. After examination, she stepped down from table and performed several slow, safe toe touches-AMAZING!  Neurological: She is alert and oriented to person, place, and time. Coordination normal.  Skin: Skin is warm and dry. No rash noted. She is not diaphoretic. No erythema. No pallor.  Psychiatric: She has a normal mood and affect. Her behavior is normal. Judgment and thought content normal.  Nursing note and vitals reviewed.         Assessment & Plan:   1. Secondary  hypertension   2. Hypothyroidism, unspecified type     Hypertension BP at goal today 147/82 Continue HCTZ 25mg  and Lisinopril 20mg  daily.   Hypothyroidism Last TSH 1.560, on Levothyroxine 50mcg daily. We re-check TSH Oct 2018 at annual CPE   Osteoporosis Continue weight bearing exercise-walking, stretching.    FOLLOW-UP:  Return in about 3 months (around 05/27/2017) for Regular Follow Up, HTN, CPE.

## 2017-02-24 NOTE — Patient Instructions (Signed)
Heart-Healthy Eating Plan Many factors influence your heart health, including eating and exercise habits. Heart (coronary) risk increases with abnormal blood fat (lipid) levels. Heart-healthy meal planning includes limiting unhealthy fats, increasing healthy fats, and making other small dietary changes. This includes maintaining a healthy body weight to help keep lipid levels within a normal range. What is my plan? Your health care provider recommends that you:  Get no more than _________% of the total calories in your daily diet from fat.  Limit your intake of saturated fat to less than _________% of your total calories each day.  Limit the amount of cholesterol in your diet to less than _________ mg per day.  What types of fat should I choose?  Choose healthy fats more often. Choose monounsaturated and polyunsaturated fats, such as olive oil and canola oil, flaxseeds, walnuts, almonds, and seeds.  Eat more omega-3 fats. Good choices include salmon, mackerel, sardines, tuna, flaxseed oil, and ground flaxseeds. Aim to eat fish at least two times each week.  Limit saturated fats. Saturated fats are primarily found in animal products, such as meats, butter, and cream. Plant sources of saturated fats include palm oil, palm kernel oil, and coconut oil.  Avoid foods with partially hydrogenated oils in them. These contain trans fats. Examples of foods that contain trans fats are stick margarine, some tub margarines, cookies, crackers, and other baked goods. What general guidelines do I need to follow?  Check food labels carefully to identify foods with trans fats or high amounts of saturated fat.  Fill one half of your plate with vegetables and green salads. Eat 4-5 servings of vegetables per day. A serving of vegetables equals 1 cup of raw leafy vegetables,  cup of raw or cooked cut-up vegetables, or  cup of vegetable juice.  Fill one fourth of your plate with whole grains. Look for the word  "whole" as the first word in the ingredient list.  Fill one fourth of your plate with lean protein foods.  Eat 4-5 servings of fruit per day. A serving of fruit equals one medium whole fruit,  cup of dried fruit,  cup of fresh, frozen, or canned fruit, or  cup of 100% fruit juice.  Eat more foods that contain soluble fiber. Examples of foods that contain this type of fiber are apples, broccoli, carrots, beans, peas, and barley. Aim to get 20-30 g of fiber per day.  Eat more home-cooked food and less restaurant, buffet, and fast food.  Limit or avoid alcohol.  Limit foods that are high in starch and sugar.  Avoid fried foods.  Cook foods by using methods other than frying. Baking, boiling, grilling, and broiling are all great options. Other fat-reducing suggestions include: ? Removing the skin from poultry. ? Removing all visible fats from meats. ? Skimming the fat off of stews, soups, and gravies before serving them. ? Steaming vegetables in water or broth.  Lose weight if you are overweight. Losing just 5-10% of your initial body weight can help your overall health and prevent diseases such as diabetes and heart disease.  Increase your consumption of nuts, legumes, and seeds to 4-5 servings per week. One serving of dried beans or legumes equals  cup after being cooked, one serving of nuts equals 1 ounces, and one serving of seeds equals  ounce or 1 tablespoon.  You may need to monitor your salt (sodium) intake, especially if you have high blood pressure. Talk with your health care provider or dietitian to get  more information about reducing sodium. What foods can I eat? Grains  Breads, including French, white, pita, wheat, raisin, rye, oatmeal, and Italian. Tortillas that are neither fried nor made with lard or trans fat. Low-fat rolls, including hotdog and hamburger buns and English muffins. Biscuits. Muffins. Waffles. Pancakes. Light popcorn. Whole-grain cereals. Flatbread.  Melba toast. Pretzels. Breadsticks. Rusks. Low-fat snacks and crackers, including oyster, saltine, matzo, graham, animal, and rye. Rice and pasta, including brown rice and those that are made with whole wheat. Vegetables All vegetables. Fruits All fruits, but limit coconut. Meats and Other Protein Sources Lean, well-trimmed beef, veal, pork, and lamb. Chicken and turkey without skin. All fish and shellfish. Wild duck, rabbit, pheasant, and venison. Egg whites or low-cholesterol egg substitutes. Dried beans, peas, lentils, and tofu.Seeds and most nuts. Dairy Low-fat or nonfat cheeses, including ricotta, string, and mozzarella. Skim or 1% milk that is liquid, powdered, or evaporated. Buttermilk that is made with low-fat milk. Nonfat or low-fat yogurt. Beverages Mineral water. Diet carbonated beverages. Sweets and Desserts Sherbets and fruit ices. Honey, jam, marmalade, jelly, and syrups. Meringues and gelatins. Pure sugar candy, such as hard candy, jelly beans, gumdrops, mints, marshmallows, and small amounts of dark chocolate. Angel food cake. Eat all sweets and desserts in moderation. Fats and Oils Nonhydrogenated (trans-free) margarines. Vegetable oils, including soybean, sesame, sunflower, olive, peanut, safflower, corn, canola, and cottonseed. Salad dressings or mayonnaise that are made with a vegetable oil. Limit added fats and oils that you use for cooking, baking, salads, and as spreads. Other Cocoa powder. Coffee and tea. All seasonings and condiments. The items listed above may not be a complete list of recommended foods or beverages. Contact your dietitian for more options. What foods are not recommended? Grains Breads that are made with saturated or trans fats, oils, or whole milk. Croissants. Butter rolls. Cheese breads. Sweet rolls. Donuts. Buttered popcorn. Chow mein noodles. High-fat crackers, such as cheese or butter crackers. Meats and Other Protein Sources Fatty meats, such  as hotdogs, short ribs, sausage, spareribs, bacon, ribeye roast or steak, and mutton. High-fat deli meats, such as salami and bologna. Caviar. Domestic duck and goose. Organ meats, such as kidney, liver, sweetbreads, brains, gizzard, chitterlings, and heart. Dairy Cream, sour cream, cream cheese, and creamed cottage cheese. Whole milk cheeses, including blue (bleu), Monterey Jack, Brie, [place], American, Havarti, Swiss, cheddar, Camembert, and [place]. Whole or 2% milk that is liquid, evaporated, or condensed. Whole buttermilk. Cream sauce or high-fat cheese sauce. Yogurt that is made from whole milk. Beverages Regular sodas and drinks with added sugar. Sweets and Desserts Frosting. Pudding. Cookies. Cakes other than angel food cake. Candy that has milk chocolate or white chocolate, hydrogenated fat, butter, coconut, or unknown ingredients. Buttered syrups. Full-fat ice cream or ice cream drinks. Fats and Oils Gravy that has suet, meat fat, or shortening. Cocoa butter, hydrogenated oils, palm oil, coconut oil, palm kernel oil. These can often be found in baked products, candy, fried foods, nondairy creamers, and whipped toppings. Solid fats and shortenings, including bacon fat, salt pork, lard, and butter. Nondairy cream substitutes, such as coffee creamers and sour cream substitutes. Salad dressings that are made of unknown oils, cheese, or sour cream. The items listed above may not be a complete list of foods and beverages to avoid. Contact your dietitian for more information. This information is not intended to replace advice given to you by your health care provider. Make sure you discuss any questions you have with your health care   provider. Document Released: 05/12/2008 Document Revised: 02/21/2016 Document Reviewed: 01/25/2014 Elsevier Interactive Patient Education  2017 [employer]  YOU ARE SIMPLY AMAZING!!! Keep up the healthy eating and regular movement. Please call clinic when you  require refills on your medications. Please schedule full physical in Oct, to include fasting labs. WELCOME TO THE PRACTICE!

## 2017-02-24 NOTE — Assessment & Plan Note (Signed)
Continue weight bearing exercise-walking, stretching.

## 2017-02-24 NOTE — Assessment & Plan Note (Signed)
BP at goal today 147/82 Continue HCTZ 25mg  and Lisinopril 20mg  daily.

## 2017-04-22 ENCOUNTER — Other Ambulatory Visit: Payer: Self-pay

## 2017-04-22 ENCOUNTER — Other Ambulatory Visit: Payer: Self-pay | Admitting: Internal Medicine

## 2017-04-22 MED ORDER — LISINOPRIL 20 MG PO TABS: 20.0000 mg | ORAL_TABLET | Freq: Every day | ORAL | 1 refills | Status: DC

## 2017-04-22 NOTE — Telephone Encounter (Signed)
We have not prescribed these medications for the patient previously.  Please review and refill if appropriate.  T. Nelson, CMA  

## 2017-05-26 ENCOUNTER — Encounter: Payer: Self-pay | Admitting: Adult Health

## 2017-05-26 ENCOUNTER — Ambulatory Visit (INDEPENDENT_AMBULATORY_CARE_PROVIDER_SITE_OTHER): Payer: Medicare Other | Admitting: Adult Health

## 2017-05-26 VITALS — BP 139/69 | HR 74 | Ht <= 58 in | Wt 113.4 lb

## 2017-05-26 DIAGNOSIS — E039 Hypothyroidism, unspecified: Secondary | ICD-10-CM

## 2017-05-26 DIAGNOSIS — Z0000 Encounter for general adult medical examination without abnormal findings: Secondary | ICD-10-CM | POA: Diagnosis not present

## 2017-05-26 DIAGNOSIS — R011 Cardiac murmur, unspecified: Secondary | ICD-10-CM | POA: Diagnosis not present

## 2017-05-26 DIAGNOSIS — E559 Vitamin D deficiency, unspecified: Secondary | ICD-10-CM

## 2017-05-26 DIAGNOSIS — E785 Hyperlipidemia, unspecified: Secondary | ICD-10-CM | POA: Diagnosis not present

## 2017-05-26 DIAGNOSIS — I159 Secondary hypertension, unspecified: Secondary | ICD-10-CM | POA: Diagnosis not present

## 2017-05-26 NOTE — Progress Notes (Signed)
Subjective:    Patient ID: Krystal Johns, female    DOB: 1923-02-08, 83 y.o.   MRN: [id number]  HPI  02/24/17 OV:Krystal Johns presents to establish as a new pt.  She is a very pleasant, AMAZING 83 year old female.  PMH:  HTN, Hypothyroidism, and heart murmur.  She denies personal hx of MI, CVA, T2D, however does have family hx + for cardiac disease and T2D.  She has recent fall March 2018 and suffered R olecranon closed fx that did not require surgical intervention.  She is compliant on medication, denies SE and generally feels quite well, stating "I just keep going".  She lives alone and is able to perform all her ADLs and IADLs and has multiple family members that live in close proximity (one sister at [facility] during encounter). She reports stretching daily and eating and heart healthy diet.  Today's OV 05/26/2017: Krystal Johns is here for CPE.  She denies any changes from initial OV in July. She is compliant on all medications, denies SE. She estimates to drink 10-20 ounces water/day and follows a heart healthy diet. She stretches and walks daily. She denies tobacco/ETOH use. She reports sound/restful sleep and only current complaint is lumbar back ache that will worsen with prolonged standing/walking.  She is unable to rate pain and treats with hot washcloth and PRN Aleve. Sister at [facility] during OV Hx of longevity in family!   Patient Care Team    Relationship Specialty Notifications Start End  William Hamburger D, NP PCP - General Family Medicine  02/24/17   Nadara Mustard, MD Consulting Physician Orthopedic Surgery  02/24/17     Patient Active Problem List   Diagnosis Date Noted  . Healthcare maintenance 05/26/2017  . Olecranon fracture, right, closed, initial encounter 11/09/2016  . Systolic murmur 11/22/2014  . Hypothyroidism 11/22/2014  . Hypertension 02/14/2011  . Osteoporosis 02/14/2011  . Vitamin D deficiency 02/14/2011     Past Medical History:  Diagnosis Date  . Asymptomatic PVCs    . Diverticulitis of colon   . Hypertension   . Osteoporosis   . PAC (premature atrial contraction)    asymptomatic  . Thyroid disease   . Vitamin D deficiency      Past Surgical History:  Procedure Laterality Date  . STONE EXTRACTION WITH BASKET       Family History  Problem Relation Age of Onset  . Heart disease Mother   . Hypertension Mother   . Heart disease Father   . Cancer Brother      History  Drug Use No     History  Alcohol Use No     History  Smoking Status  . Never Smoker  Smokeless Tobacco  . Never Used     Outpatient Encounter Prescriptions as of 05/26/2017  Medication Sig  . Cholecalciferol (VITAMIN D3) 2000 units TABS Take by mouth daily.  . hydrochlorothiazide (HYDRODIURIL) 25 MG tablet Take 1 tablet (25 mg total) by mouth daily.  Marland Kitchen levothyroxine (SYNTHROID, LEVOTHROID) 50 MCG tablet TAKE 1 TABLET (50 MCG TOTAL) BY MOUTH DAILY.  Marland Kitchen lisinopril (PRINIVIL,ZESTRIL) 20 MG tablet Take 1 tablet (20 mg total) by mouth daily.   No facility-administered encounter medications on file as of 05/26/2017.     Allergies: Penicillins  Body mass index is 23.7 kg/m.  Blood pressure 139/69, pulse 74, height  (1.473 m), weight 113 lb 6.4 oz (51.4 kg).    Review of Systems  Constitutional: Negative for activity change, appetite  change, chills, diaphoresis, fever and unexpected weight change.  HENT: Negative for congestion.   Eyes: Negative for visual disturbance.  Respiratory: Negative for cough, chest tightness, shortness of breath, wheezing and stridor.   Cardiovascular: Positive for leg swelling. Negative for chest pain and palpitations.  Gastrointestinal: Negative for abdominal distention, abdominal pain, constipation, diarrhea, nausea and vomiting.  Endocrine: Negative for cold intolerance, heat intolerance, polydipsia, polyphagia and polyuria.  Genitourinary: Negative for difficulty urinating, flank pain and hematuria.  Musculoskeletal:  Positive for arthralgias, joint swelling and myalgias. Negative for back pain, gait problem, neck pain and neck stiffness.  Skin: Negative for color change, pallor, rash and wound.  Allergic/Immunologic: Negative for immunocompromised state.  Neurological: Negative for dizziness, tremors, weakness and headaches.  Hematological: Does not bruise/bleed easily.  Psychiatric/Behavioral: Negative for confusion, dysphoric mood, self-injury, sleep disturbance and suicidal ideas. The patient is not nervous/anxious and is not hyperactive.        Objective:   Physical Exam  Constitutional: She is oriented to person, place, and time. She appears well-developed and well-nourished. No distress.  HENT:  Head: Normocephalic and atraumatic.  Right Ear: External ear normal. Decreased hearing is noted.  Left Ear: External ear normal. Decreased hearing is noted.  Nose: Right sinus exhibits no maxillary sinus tenderness and no frontal sinus tenderness. Left sinus exhibits no maxillary sinus tenderness and no frontal sinus tenderness.  Mouth/Throat: Uvula is midline. Abnormal dentition.  Missing a few tooth, yet no active tooth decay noted.  Cardiovascular: Normal rate, regular rhythm and intact distal pulses.   Murmur heard. Pulmonary/Chest: Effort normal and breath sounds normal. No respiratory distress. She has no wheezes. She has no rales. She exhibits no tenderness.  Musculoskeletal: She exhibits edema and deformity.       Right ankle: She exhibits swelling. She exhibits normal range of motion and normal pulse.       Left ankle: She exhibits normal range of motion, no swelling and normal pulse.       Lumbar back: She exhibits tenderness.  Thoracic scoliosis. After examination, she stepped down from table and performed several slow, safe toe touches-AMAZING!  Neurological: She is alert and oriented to person, place, and time. Coordination normal.  Skin: Skin is warm and dry. No rash noted. She is not  diaphoretic. No erythema. No pallor.  Psychiatric: She has a normal mood and affect. Her behavior is normal. Judgment and thought content normal.  Nursing note and vitals reviewed.         Assessment & Plan:   1. Healthcare maintenance   2. Vitamin D deficiency   3. Secondary hypertension   4. Hypothyroidism, unspecified type   5. Systolic murmur     Hypertension BP at goal 139/69, HR 74 Continue Linsinopril  and HCTZ  daily.  Hypothyroidism Re-checked TSH today Currently on daily  Systolic murmur Discussed with pt and pt's sister today. She denies current cardiac sx's.  Healthcare maintenance Labs obtained today. Did not check A1c- no family hx, no personal hx of elevated BS, has not had level checked >6 years. Encouraged to increase water intake and continue healthy lifestyle- daily walking/stretching, heart healthy diet F/u 6 months, sooner if needed.    FOLLOW-UP:  Return in about 6 months (around 11/24/2017) for Regular Follow Up.

## 2017-05-26 NOTE — Patient Instructions (Addendum)
Heart-Healthy Eating Plan Many factors influence your heart health, including eating and exercise habits. Heart (coronary) risk increases with abnormal blood fat (lipid) levels. Heart-healthy meal planning includes limiting unhealthy fats, increasing healthy fats, and making other small dietary changes. This includes maintaining a healthy body weight to help keep lipid levels within a normal range. What is my plan? Your health care provider recommends that you:  Get no more than __25__% of the total calories in your daily diet from fat.  Limit your intake of saturated fat to less than __5___% of your total calories each day.  Limit the amount of cholesterol in your diet to less than __300__ mg per day.  What types of fat should I choose?  Choose healthy fats more often. Choose monounsaturated and polyunsaturated fats, such as olive oil and canola oil, flaxseeds, walnuts, almonds, and seeds.  Eat more omega-3 fats. Good choices include salmon, mackerel, sardines, tuna, flaxseed oil, and ground flaxseeds. Aim to eat fish at least two times each week.  Limit saturated fats. Saturated fats are primarily found in animal products, such as meats, butter, and cream. Plant sources of saturated fats include palm oil, palm kernel oil, and coconut oil.  Avoid foods with partially hydrogenated oils in them. These contain trans fats. Examples of foods that contain trans fats are stick margarine, some tub margarines, cookies, crackers, and other baked goods. What general guidelines do I need to follow?  Check food labels carefully to identify foods with trans fats or high amounts of saturated fat.  Fill one half of your plate with vegetables and green salads. Eat 4-5 servings of vegetables per day. A serving of vegetables equals 1 cup of raw leafy vegetables,  cup of raw or cooked cut-up vegetables, or  cup of vegetable juice.  Fill one fourth of your plate with whole grains. Look for the word "whole"  as the first word in the ingredient list.  Fill one fourth of your plate with lean protein foods.  Eat 4-5 servings of fruit per day. A serving of fruit equals one medium whole fruit,  cup of dried fruit,  cup of fresh, frozen, or canned fruit, or  cup of 100% fruit juice.  Eat more foods that contain soluble fiber. Examples of foods that contain this type of fiber are apples, broccoli, carrots, beans, peas, and barley. Aim to get 20-30 g of fiber per day.  Eat more home-cooked food and less restaurant, buffet, and fast food.  Limit or avoid alcohol.  Limit foods that are high in starch and sugar.  Avoid fried foods.  Cook foods by using methods other than frying. Baking, boiling, grilling, and broiling are all great options. Other fat-reducing suggestions include: ? Removing the skin from poultry. ? Removing all visible fats from meats. ? Skimming the fat off of stews, soups, and gravies before serving them. ? Steaming vegetables in water or broth.  Lose weight if you are overweight. Losing just 5-10% of your initial body weight can help your overall health and prevent diseases such as diabetes and heart disease.  Increase your consumption of nuts, legumes, and seeds to 4-5 servings per week. One serving of dried beans or legumes equals  cup after being cooked, one serving of nuts equals 1 ounces, and one serving of seeds equals  ounce or 1 tablespoon.  You may need to monitor your salt (sodium) intake, especially if you have high blood pressure. Talk with your health care provider or dietitian to get  more information about reducing sodium. What foods can I eat? Grains  Breads, including [place], white, pita, wheat, raisin, rye, oatmeal, and [place]. Tortillas that are neither fried nor made with lard or trans fat. Low-fat rolls, including hotdog and hamburger buns and English muffins. Biscuits. Muffins. Waffles. Pancakes. Light popcorn. Whole-grain cereals. Flatbread. Melba  toast. Pretzels. Breadsticks. Rusks. Low-fat snacks and crackers, including oyster, saltine, matzo, graham, animal, and rye. Rice and pasta, including brown rice and those that are made with whole wheat. Vegetables All vegetables. Fruits All fruits, but limit coconut. Meats and Other Protein Sources Lean, well-trimmed beef, veal, pork, and lamb. Chicken and [place] without skin. All fish and shellfish. Wild duck, rabbit, pheasant, and venison. Egg whites or low-cholesterol egg substitutes. Dried beans, peas, lentils, and tofu.Seeds and most nuts. Dairy Low-fat or nonfat cheeses, including ricotta, string, and mozzarella. Skim or 1% milk that is liquid, powdered, or evaporated. Buttermilk that is made with low-fat milk. Nonfat or low-fat yogurt. Beverages Mineral water. Diet carbonated beverages. Sweets and Desserts Sherbets and fruit ices. Honey, jam, marmalade, jelly, and syrups. Meringues and gelatins. Pure sugar candy, such as hard candy, jelly beans, gumdrops, mints, marshmallows, and small amounts of dark chocolate. [employer] Eat all sweets and desserts in moderation. Fats and Oils Nonhydrogenated (trans-free) margarines. Vegetable oils, including soybean, sesame, sunflower, olive, peanut, safflower, corn, canola, and cottonseed. Salad dressings or mayonnaise that are made with a vegetable oil. Limit added fats and oils that you use for cooking, baking, salads, and as spreads. Other Cocoa powder. Coffee and tea. All seasonings and condiments. The items listed above may not be a complete list of recommended foods or beverages. Contact your dietitian for more options. What foods are not recommended? Grains Breads that are made with saturated or trans fats, oils, or whole milk. Croissants. Butter rolls. Cheese breads. Sweet rolls. Donuts. Buttered popcorn. Chow mein noodles. High-fat crackers, such as cheese or butter crackers. Meats and Other Protein Sources Fatty meats, such as  hotdogs, short ribs, sausage, spareribs, bacon, ribeye roast or steak, and mutton. High-fat deli meats, such as salami and bologna. Caviar. Domestic duck and goose. Organ meats, such as kidney, liver, sweetbreads, brains, gizzard, chitterlings, and heart. Dairy Cream, sour cream, cream cheese, and creamed cottage cheese. Whole milk cheeses, including blue (bleu), Monterey Jack, [place], [place], American, [place], Swiss, [place], [place], and [place]. Whole or 2% milk that is liquid, evaporated, or condensed. Whole buttermilk. Cream sauce or high-fat cheese sauce. Yogurt that is made from whole milk. Beverages Regular sodas and drinks with added sugar. Sweets and Desserts Frosting. Pudding. Cookies. Cakes other than angel food cake. Candy that has milk chocolate or white chocolate, hydrogenated fat, butter, coconut, or unknown ingredients. Buttered syrups. Full-fat ice cream or ice cream drinks. Fats and Oils Gravy that has suet, meat fat, or shortening. Cocoa butter, hydrogenated oils, palm oil, coconut oil, palm kernel oil. These can often be found in baked products, candy, fried foods, nondairy creamers, and whipped toppings. Solid fats and shortenings, including bacon fat, salt pork, lard, and butter. Nondairy cream substitutes, such as coffee creamers and sour cream substitutes. Salad dressings that are made of unknown oils, cheese, or sour cream. The items listed above may not be a complete list of foods and beverages to avoid. Contact your dietitian for more information. This information is not intended to replace advice given to you by your health care provider. Make sure you discuss any questions you have with your health care  provider. Document Released: 05/12/2008 Document Revised: 02/21/2016 Document Reviewed: 01/25/2014 Elsevier Interactive Patient Education  2017 Elsevier Inc.   [employer] Therapy Heat therapy can help ease sore, stiff, injured, and tight muscles and joints. Heat relaxes your  muscles, which may help ease your pain. What are the risks? If you have any of the following conditions, do not use heat therapy unless your health care provider has approved:  Poor circulation.  Healing wounds or scarred skin in the area being treated.  Diabetes, heart disease, or high blood pressure.  Not being able to feel (numbness) the area being treated.  Unusual swelling of the area being treated.  Active infections.  Blood clots.  Cancer.  Inability to communicate pain. This may include young children and people who have problems with their brain function (dementia).  Pregnancy.  Heat therapy should only be used on old, pre-existing, or long-lasting (chronic) injuries. Do not use heat therapy on new injuries unless directed by your health care provider. How to use heat therapy There are several different kinds of heat therapy, including:  Moist heat pack.  Warm water bath.  Hot water bottle.  Electric heating pad.  Heated gel pack.  Heated wrap.  Electric heating pad.  Use the heat therapy method suggested by your health care provider. Follow your health care provider's instructions on when and how to use heat therapy. General heat therapy recommendations  Do not sleep while using heat therapy. Only use heat therapy while you are awake.  Your skin may turn pink while using heat therapy. Do not use heat therapy if your skin turns red.  Do not use heat therapy if you have new pain.  High heat or long exposure to heat can cause burns. Be careful when using heat therapy to avoid burning your skin.  Do not use heat therapy on areas of your skin that are already irritated, such as with a rash or sunburn. Contact a health care provider if:  You have blisters, redness, swelling, or numbness.  You have new pain.  Your pain is worse. This information is not intended to replace advice given to you by your health care provider. Make sure you discuss any  questions you have with your health care provider. Document Released: 10/26/2011 Document Revised: 01/09/2016 Document Reviewed: 09/26/2013 Elsevier Interactive Patient Education  2018 [employer].  Continue all medications as directed. Increase water intake, strive for at least 40 ounces/day. We will call when your lab results are available. Continue healthy eating and regular movement/stretches. For back pain: Daily stretching, heating pad, Epson Salt Baths, as needed NSAIDs (Ibuprofen, Aleve). YOU ARE DOING GREAT! Follow-up in 6 months, sooner if needed. NICE TO SEE YOU!

## 2017-05-26 NOTE — Assessment & Plan Note (Signed)
Re-checked TSH today Currently on daily

## 2017-05-26 NOTE — Assessment & Plan Note (Addendum)
Discussed with pt and pt's sister today. She denies current cardiac sx's.

## 2017-05-26 NOTE — Assessment & Plan Note (Addendum)
BP at goal 139/69, HR 74 Continue Linsinopril  and HCTZ  daily.

## 2017-05-26 NOTE — Assessment & Plan Note (Addendum)
Labs obtained today. Did not check A1c- no family hx, no personal hx of elevated BS, has not had level checked >6 years. Encouraged to increase water intake and continue healthy lifestyle- daily walking/stretching, heart healthy diet F/u 6 months, sooner if needed.

## 2017-05-27 LAB — CBC WITH DIFFERENTIAL/PLATELET
BASOS: 1 %
Basophils Absolute: 0 10*3/uL (ref 0.0–0.2)
EOS (ABSOLUTE): 0 10*3/uL (ref 0.0–0.4)
EOS: 1 %
HEMATOCRIT: 39.6 % (ref 34.0–46.6)
Hemoglobin: 12.9 g/dL (ref 11.1–15.9)
IMMATURE GRANS (ABS): 0 10*3/uL (ref 0.0–0.1)
IMMATURE GRANULOCYTES: 1 %
LYMPHS: 27 %
Lymphocytes Absolute: 1.5 10*3/uL (ref 0.7–3.1)
MCH: 31 pg (ref 26.6–33.0)
MCHC: 32.6 g/dL (ref 31.5–35.7)
MCV: 95 fL (ref 79–97)
MONOS ABS: 0.6 10*3/uL (ref 0.1–0.9)
Monocytes: 10 %
NEUTROS PCT: 60 %
Neutrophils Absolute: 3.5 10*3/uL (ref 1.4–7.0)
PLATELETS: 312 10*3/uL (ref 150–379)
RBC: 4.16 x10E6/uL (ref 3.77–5.28)
RDW: 13.6 % (ref 12.3–15.4)
WBC: 5.7 10*3/uL (ref 3.4–10.8)

## 2017-05-27 LAB — LIPID PANEL
CHOLESTEROL TOTAL: 217 mg/dL — AB (ref 100–199)
Chol/HDL Ratio: 3.8 ratio (ref 0.0–4.4)
HDL: 57 mg/dL (ref >39)
LDL CALC: 136 mg/dL — AB (ref 0–99)
TRIGLYCERIDES: 118 mg/dL (ref 0–149)
VLDL CHOLESTEROL CAL: 24 mg/dL (ref 5–40)

## 2017-05-27 LAB — COMPREHENSIVE METABOLIC PANEL
ALT: 9 IU/L (ref 0–32)
AST: 13 IU/L (ref 0–40)
Albumin/Globulin Ratio: 1.6 (ref 1.2–2.2)
Albumin: 4.3 g/dL (ref 3.2–4.6)
Alkaline Phosphatase: 61 IU/L (ref 39–117)
BUN/Creatinine Ratio: 23 (ref 12–28)
BUN: 26 mg/dL (ref 10–36)
Bilirubin Total: 0.4 mg/dL (ref 0.0–1.2)
CALCIUM: 9.3 mg/dL (ref 8.7–10.3)
CO2: 25 mmol/L (ref 20–29)
CREATININE: 1.11 mg/dL — AB (ref 0.57–1.00)
Chloride: 103 mmol/L (ref 96–106)
GFR, EST AFRICAN AMERICAN: 50 mL/min/{1.73_m2} — AB (ref >59)
GFR, EST NON AFRICAN AMERICAN: 43 mL/min/{1.73_m2} — AB (ref >59)
GLOBULIN, TOTAL: 2.7 g/dL (ref 1.5–4.5)
Glucose: 101 mg/dL — ABNORMAL HIGH (ref 65–99)
Potassium: 4.6 mmol/L (ref 3.5–5.2)
Sodium: 139 mmol/L (ref 134–144)
TOTAL PROTEIN: 7 g/dL (ref 6.0–8.5)

## 2017-05-27 LAB — TSH: TSH: 1.72 u[IU]/mL (ref 0.450–4.500)

## 2017-05-27 LAB — VITAMIN D 25 HYDROXY (VIT D DEFICIENCY, FRACTURES): Vit D, 25-Hydroxy: 45.2 ng/mL (ref 30.0–100.0)

## 2017-06-01 ENCOUNTER — Encounter: Payer: Medicare Other | Admitting: Internal Medicine

## 2017-06-15 ENCOUNTER — Other Ambulatory Visit: Payer: Self-pay

## 2017-06-15 MED ORDER — LEVOTHYROXINE SODIUM 50 MCG PO TABS: ORAL_TABLET | ORAL | 0 refills | Status: DC

## 2017-09-13 ENCOUNTER — Other Ambulatory Visit: Payer: Self-pay | Admitting: Adult Health

## 2017-10-21 ENCOUNTER — Other Ambulatory Visit: Payer: Self-pay | Admitting: Adult Health

## 2017-11-23 NOTE — Progress Notes (Signed)
Subjective:    Patient ID: Krystal Johns, female    DOB: [birth year], 84 y.o.   MRN: [id number]  HPI 02/24/17 OV:Krystal Johns presents to establish as a new pt.  She is a very pleasant, AMAZING 84 year old female.  PMH:  HTN, Hypothyroidism, and heart murmur.  She denies personal hx of MI, CVA, T2D, however does have family hx + for cardiac disease and T2D.  She has recent fall March 2018 and suffered R olecranon closed fx that did not require surgical intervention.  She is compliant on medication, denies SE and generally feels quite well, stating "I just keep going".  She lives alone and is able to perform all her ADLs and IADLs and has multiple family members that live in close proximity (one sister at [facility] during encounter). She reports stretching daily and eating and heart healthy diet.  Today's OV 05/26/2017: Krystal Johns is here for CPE.  She denies any changes from initial OV in July. She is compliant on all medications, denies SE. She estimates to drink 10-20 ounces water/day and follows a heart healthy diet. She stretches and walks daily. She denies tobacco/ETOH use. She reports sound/restful sleep and only current complaint is lumbar back ache that will worsen with prolonged standing/walking.  She is unable to rate pain and treats with hot washcloth and PRN Aleve. Sister at [facility] during OV Hx of longevity in family!  11/24/17 OV: Krystal Johns presents for regular f/u: HTN, hypothyroidism.  She reports medication compliance, denies SE. She has been trying to increase water intake, estimates "maybe a few glasses/day". She denies acute complaints today. We discussed risks/benefits of DEXA scan, she has hx of Osteoporosis.   She denies hx of falls/fx's and eats a well balanced diet, rich in calcium. She takes vit d supplement and would decline bisphosphate therapy. Pt declined DEXA scan Her daughter at [facility]- she would like her mother to have either "some assistance during the day, someone to at least  drop in and check on her" or home medical alert system. Pt lives alone and can cook/clean and care for herself without difficulty. She does not drive, never has.  Patient Care Team    Relationship Specialty Notifications Start End  William Hamburger D, NP PCP - General Family Medicine  02/24/17   Nadara Mustard, MD Consulting Physician Orthopedic Surgery  02/24/17     Patient Active Problem List   Diagnosis Date Noted  . Healthcare maintenance 05/26/2017  . Olecranon fracture, right, closed, initial encounter 11/09/2016  . Systolic murmur 11/22/2014  . Hypothyroidism 11/22/2014  . Hypertension 02/14/2011  . Osteoporosis 02/14/2011  . Vitamin D deficiency 02/14/2011     Past Medical History:  Diagnosis Date  . Asymptomatic PVCs   . Diverticulitis of colon   . Hypertension   . Osteoporosis   . PAC (premature atrial contraction)    asymptomatic  . Thyroid disease   . Vitamin D deficiency      Past Surgical History:  Procedure Laterality Date  . STONE EXTRACTION WITH BASKET       Family History  Problem Relation Age of Onset  . Heart disease Mother   . Hypertension Mother   . Heart disease Father   . Cancer Brother      Social History   Substance and Sexual Activity  Drug Use No     Social History   Substance and Sexual Activity  Alcohol Use No     Social History   Tobacco  Use  Smoking Status Never Smoker  Smokeless Tobacco Never Used     Outpatient Encounter Medications as of 11/24/2017  Medication Sig  . Cholecalciferol (VITAMIN D3) 2000 units TABS Take by mouth daily.  . hydrochlorothiazide (HYDRODIURIL) 25 MG tablet Take 1 tablet (25 mg total) by mouth daily.  Marland Kitchen. levothyroxine (SYNTHROID, LEVOTHROID) 50 MCG tablet TAKE 1 TABLET (50 MCG TOTAL) BY MOUTH DAILY.  Marland Kitchen. lisinopril (PRINIVIL,ZESTRIL) 20 MG tablet TAKE 1 TABLET (20 MG TOTAL) BY MOUTH DAILY.   No facility-administered encounter medications on file as of 11/24/2017.      Allergies: Penicillins  Body mass index is 23.87 kg/m.  Blood pressure (!) 162/70, pulse 68, height 4\' 10"  (1.473 m), weight 114 lb 3.2 oz (51.8 kg).    Review of Systems  Constitutional: Negative for activity change, appetite change, chills, diaphoresis, fever and unexpected weight change.  HENT: Negative for congestion.   Eyes: Negative for visual disturbance.  Respiratory: Negative for cough, chest tightness, shortness of breath, wheezing and stridor.   Cardiovascular: Positive for leg swelling. Negative for chest pain and palpitations.  Gastrointestinal: Negative for abdominal distention, abdominal pain, constipation, diarrhea, nausea and vomiting.  Endocrine: Negative for cold intolerance, heat intolerance, polydipsia, polyphagia and polyuria.  Genitourinary: Negative for difficulty urinating, flank pain and hematuria.  Musculoskeletal: Positive for arthralgias, joint swelling and myalgias. Negative for back pain, gait problem, neck pain and neck stiffness.  Skin: Negative for color change, pallor, rash and wound.  Allergic/Immunologic: Negative for immunocompromised state.  Neurological: Negative for dizziness, tremors, weakness and headaches.  Hematological: Does not bruise/bleed easily.  Psychiatric/Behavioral: Negative for confusion, dysphoric mood, self-injury, sleep disturbance and suicidal ideas. The patient is not nervous/anxious and is not hyperactive.        Objective:   Physical Exam  Constitutional: She is oriented to person, place, and time. She appears well-developed and well-nourished. No distress.  HENT:  Head: Normocephalic and atraumatic.  Right Ear: External ear normal. Decreased hearing is noted.  Left Ear: External ear normal. Decreased hearing is noted.  Nose: Right sinus exhibits no maxillary sinus tenderness and no frontal sinus tenderness. Left sinus exhibits no maxillary sinus tenderness and no frontal sinus tenderness.  Mouth/Throat: Uvula is  midline. Abnormal dentition.  Missing a few teeth, yet no active tooth decay noted.  Cardiovascular: Normal rate, regular rhythm and intact distal pulses.  Murmur heard. Pulmonary/Chest: Effort normal and breath sounds normal. No respiratory distress. She has no wheezes. She has no rales. She exhibits no tenderness.  Musculoskeletal: She exhibits edema and deformity.       Right ankle: She exhibits swelling. She exhibits normal range of motion and normal pulse.       Left ankle: She exhibits normal range of motion, no swelling and normal pulse.       Lumbar back: She exhibits tenderness.  Thoracic scoliosis.   Neurological: She is alert and oriented to person, place, and time. Coordination normal.  Skin: Skin is warm and dry. No rash noted. She is not diaphoretic. No erythema. No pallor.  Psychiatric: She has a normal mood and affect. Her behavior is normal. Judgment and thought content normal.  Nursing note and vitals reviewed.     Assessment & Plan:   1. Healthcare maintenance   2. Secondary hypertension   3. Systolic murmur     Healthcare maintenance Please continue all medications as directed. Continue to increase daily water intake. Continue daily calisthenics. STRONGLY recommend home medical alert system. Please follow-up  in 6 months for physical with fasting labs.  Osteoporosis We discussed risks/benefits of DEXA scan, she has hx of Osteoporosis.   She denies hx of falls/fx's and eats a well balanced diet, rich in calcium. She takes vit d supplement and would decline bisphosphate therapy. Pt declined DEXA scan  Hypertension BP slightly elevated 162/70, HR 68 Continue Lisinopril 20mg  and HCTZ 25mg  QD  Systolic murmur Asymptomatic     FOLLOW-UP:  Return in about 6 months (around 05/26/2018) for CPE, Fasting Labs. 1. Healthcare maintenance   2. Secondary hypertension   3. Systolic murmur     Healthcare maintenance Please continue all medications as  directed. Continue to increase daily water intake. Continue daily calisthenics. STRONGLY recommend home medical alert system. Please follow-up in 6 months for physical with fasting labs.  Osteoporosis We discussed risks/benefits of DEXA scan, she has hx of Osteoporosis.   She denies hx of falls/fx's and eats a well balanced diet, rich in calcium. She takes vit d supplement and would decline bisphosphate therapy. Pt declined DEXA scan  Hypertension BP slightly elevated 162/70, HR 68 Continue Lisinopril 20mg  and HCTZ 25mg  QD  Systolic murmur Asymptomatic     FOLLOW-UP:  Return in about 6 months (around 05/26/2018) for CPE, Fasting Labs.

## 2017-11-24 ENCOUNTER — Ambulatory Visit (INDEPENDENT_AMBULATORY_CARE_PROVIDER_SITE_OTHER): Payer: Medicare Other | Admitting: Adult Health

## 2017-11-24 ENCOUNTER — Encounter: Payer: Self-pay | Admitting: Adult Health

## 2017-11-24 VITALS — BP 162/70 | HR 68 | Ht <= 58 in | Wt 114.2 lb

## 2017-11-24 DIAGNOSIS — Z0000 Encounter for general adult medical examination without abnormal findings: Secondary | ICD-10-CM

## 2017-11-24 DIAGNOSIS — R011 Cardiac murmur, unspecified: Secondary | ICD-10-CM | POA: Diagnosis not present

## 2017-11-24 DIAGNOSIS — I159 Secondary hypertension, unspecified: Secondary | ICD-10-CM | POA: Diagnosis not present

## 2017-11-24 NOTE — Assessment & Plan Note (Signed)
Please continue all medications as directed. Continue to increase daily water intake. Continue daily calisthenics. STRONGLY recommend home medical alert system. Please follow-up in 6 months for physical with fasting labs.

## 2017-11-24 NOTE — Assessment & Plan Note (Signed)
We discussed risks/benefits of DEXA scan, she has hx of Osteoporosis.   She denies hx of falls/fx's and eats a well balanced diet, rich in calcium. She takes vit d supplement and would decline bisphosphate therapy. Pt declined DEXA scan

## 2017-11-24 NOTE — Assessment & Plan Note (Signed)
Asymptomatic. 

## 2017-11-24 NOTE — Patient Instructions (Signed)

## 2017-11-24 NOTE — Assessment & Plan Note (Addendum)
BP slightly elevated 162/70, HR 68 Continue Lisinopril 20mg  and HCTZ 25mg  QD

## 2017-12-13 ENCOUNTER — Other Ambulatory Visit: Payer: Self-pay | Admitting: Adult Health

## 2018-01-19 ENCOUNTER — Other Ambulatory Visit: Payer: Self-pay | Admitting: Adult Health

## 2018-02-28 ENCOUNTER — Other Ambulatory Visit: Payer: Self-pay | Admitting: Adult Health

## 2018-05-19 ENCOUNTER — Other Ambulatory Visit (INDEPENDENT_AMBULATORY_CARE_PROVIDER_SITE_OTHER): Payer: Medicare Other

## 2018-05-19 DIAGNOSIS — E559 Vitamin D deficiency, unspecified: Secondary | ICD-10-CM

## 2018-05-19 DIAGNOSIS — E039 Hypothyroidism, unspecified: Secondary | ICD-10-CM | POA: Diagnosis not present

## 2018-05-19 DIAGNOSIS — E785 Hyperlipidemia, unspecified: Secondary | ICD-10-CM | POA: Diagnosis not present

## 2018-05-19 DIAGNOSIS — I159 Secondary hypertension, unspecified: Secondary | ICD-10-CM | POA: Diagnosis not present

## 2018-05-19 DIAGNOSIS — Z0000 Encounter for general adult medical examination without abnormal findings: Secondary | ICD-10-CM

## 2018-05-19 DIAGNOSIS — R7301 Impaired fasting glucose: Secondary | ICD-10-CM | POA: Diagnosis not present

## 2018-05-20 LAB — CBC WITH DIFFERENTIAL/PLATELET
BASOS: 1 %
Basophils Absolute: 0 10*3/uL (ref 0.0–0.2)
EOS (ABSOLUTE): 0.1 10*3/uL (ref 0.0–0.4)
EOS: 2 %
HEMATOCRIT: 34.1 % (ref 34.0–46.6)
Hemoglobin: 11.6 g/dL (ref 11.1–15.9)
IMMATURE GRANS (ABS): 0 10*3/uL (ref 0.0–0.1)
Immature Granulocytes: 1 %
LYMPHS: 24 %
Lymphocytes Absolute: 1.4 10*3/uL (ref 0.7–3.1)
MCH: 30.6 pg (ref 26.6–33.0)
MCHC: 34 g/dL (ref 31.5–35.7)
MCV: 90 fL (ref 79–97)
Monocytes Absolute: 0.6 10*3/uL (ref 0.1–0.9)
Monocytes: 10 %
NEUTROS PCT: 62 %
Neutrophils Absolute: 3.8 10*3/uL (ref 1.4–7.0)
PLATELETS: 271 10*3/uL (ref 150–450)
RBC: 3.79 x10E6/uL (ref 3.77–5.28)
RDW: 13 % (ref 12.3–15.4)
WBC: 5.9 10*3/uL (ref 3.4–10.8)

## 2018-05-20 LAB — LIPID PANEL
CHOLESTEROL TOTAL: 186 mg/dL (ref 100–199)
Chol/HDL Ratio: 4.1 ratio (ref 0.0–4.4)
HDL: 45 mg/dL (ref >39)
LDL Calculated: 117 mg/dL — ABNORMAL HIGH (ref 0–99)
Triglycerides: 119 mg/dL (ref 0–149)
VLDL CHOLESTEROL CAL: 24 mg/dL (ref 5–40)

## 2018-05-20 LAB — HEMOGLOBIN A1C
Est. average glucose Bld gHb Est-mCnc: 114 mg/dL
Hgb A1c MFr Bld: 5.6 % (ref 4.8–5.6)

## 2018-05-20 LAB — COMPREHENSIVE METABOLIC PANEL
ALT: 8 IU/L (ref 0–32)
AST: 10 IU/L (ref 0–40)
Albumin/Globulin Ratio: 1.3 (ref 1.2–2.2)
Albumin: 3.7 g/dL (ref 3.2–4.6)
Alkaline Phosphatase: 73 IU/L (ref 39–117)
BUN/Creatinine Ratio: 23 (ref 12–28)
BUN: 23 mg/dL (ref 10–36)
Bilirubin Total: 0.4 mg/dL (ref 0.0–1.2)
CO2: 24 mmol/L (ref 20–29)
CREATININE: 0.98 mg/dL (ref 0.57–1.00)
Calcium: 9 mg/dL (ref 8.7–10.3)
Chloride: 104 mmol/L (ref 96–106)
GFR, EST AFRICAN AMERICAN: 58 mL/min/{1.73_m2} — AB (ref >59)
GFR, EST NON AFRICAN AMERICAN: 50 mL/min/{1.73_m2} — AB (ref >59)
GLOBULIN, TOTAL: 2.9 g/dL (ref 1.5–4.5)
Glucose: 95 mg/dL (ref 65–99)
Potassium: 4.5 mmol/L (ref 3.5–5.2)
Sodium: 140 mmol/L (ref 134–144)
TOTAL PROTEIN: 6.6 g/dL (ref 6.0–8.5)

## 2018-05-20 LAB — TSH: TSH: 1.85 u[IU]/mL (ref 0.450–4.500)

## 2018-05-22 ENCOUNTER — Other Ambulatory Visit: Payer: Self-pay | Admitting: Adult Health

## 2018-05-25 NOTE — Progress Notes (Signed)
Subjective:    Patient ID: Krystal Johns, female    DOB: [birth year], 84 y.o.   MRN: [id number]  HPI 02/24/17 OV:Krystal Johns presents to establish as a new pt.  She is a very pleasant, AMAZING 84 year old female.  PMH:  HTN, Hypothyroidism, and heart murmur.  She denies personal hx of MI, CVA, T2D, however does have family hx + for cardiac disease and T2D.  She has recent fall March 2018 and suffered R olecranon closed fx that did not require surgical intervention.  She is compliant on medication, denies SE and generally feels quite well, stating "I just keep going".  She lives alone and is able to perform all her ADLs and IADLs and has multiple family members that live in close proximity (one sister at [facility] during encounter). She reports stretching daily and eating and heart healthy diet.  Today's OV 05/26/2017: Krystal Johns is here for CPE.  She denies any changes from initial OV in July. She is compliant on all medications, denies SE. She estimates to drink 10-20 ounces water/day and follows a heart healthy diet. She stretches and walks daily. She denies tobacco/ETOH use. She reports sound/restful sleep and only current complaint is lumbar back ache that will worsen with prolonged standing/walking.  She is unable to rate pain and treats with hot washcloth and PRN Aleve. Sister at [facility] during OV Hx of longevity in family!  11/24/17 OV: Krystal Johns presents for regular f/u: HTN, hypothyroidism.  She reports medication compliance, denies SE. She has been trying to increase water intake, estimates "maybe a few glasses/day". She denies acute complaints today. We discussed risks/benefits of DEXA scan, she has hx of Osteoporosis.   She denies hx of falls/fx's and eats a well balanced diet, rich in calcium. She takes vit d supplement and would decline bisphosphate therapy. Pt declined DEXA scan Her daughter at [facility]- she would like her mother to have either "some assistance during the day, someone to at least  drop in and check on her" or home medical alert system. Pt lives alone and can cook/clean and care for herself without difficulty. She does not drive, never has.  05/26/18 OV: Krystal Johns is here for CPE She stopped eating peanut butter and increased water intake. She remains active around the house and denies recent falls or instability issues. She reports medication compliance, denies SE She denies acute complaints today. She declined DEXA screening She denies various vaccinations today She refused to dress in gown for physical, preferred to remain in street clothes Reviewed recent labs LDL 117, last yr 136 GFR 50, last yr 90  Daughter at [facility] during OV  Patient Care Team    Relationship Specialty Notifications Start End  Belton, Krystal Bur D, NP PCP - General Family Medicine  02/24/17   Krystal Mustard, MD Consulting Physician Orthopedic Surgery  02/24/17     Patient Active Problem List   Diagnosis Date Noted  . Elevated LDL cholesterol level 05/26/2018  . Healthcare maintenance 05/26/2017  . Olecranon fracture, right, closed, initial encounter 11/09/2016  . Systolic murmur 11/22/2014  . Hypothyroidism 11/22/2014  . Hypertension 02/14/2011  . Osteoporosis 02/14/2011  . Vitamin D deficiency 02/14/2011     Past Medical History:  Diagnosis Date  . Asymptomatic PVCs   . Diverticulitis of colon   . Hypertension   . Osteoporosis   . PAC (premature atrial contraction)    asymptomatic  . Thyroid disease   . Vitamin D deficiency  Past Surgical History:  Procedure Laterality Date  . STONE EXTRACTION WITH BASKET       Family History  Problem Relation Age of Onset  . Heart disease Mother   . Hypertension Mother   . Heart disease Father   . Cancer Brother      Social History   Substance and Sexual Activity  Drug Use No     Social History   Substance and Sexual Activity  Alcohol Use No     Social History   Tobacco Use  Smoking Status Never Smoker   Smokeless Tobacco Never Used     Outpatient Encounter Medications as of 05/26/2018  Medication Sig  . Cholecalciferol (VITAMIN D3) 2000 units TABS Take by mouth daily.  . hydrochlorothiazide (HYDRODIURIL) 25 MG tablet TAKE 1 TABLET BY MOUTH DAILY.  Marland Kitchen levothyroxine (SYNTHROID, LEVOTHROID) 50 MCG tablet TAKE 1 TABLET BY MOUTH DAILY.  Marland Kitchen lisinopril (PRINIVIL,ZESTRIL) 20 MG tablet TAKE 1 TABLET (20 MG TOTAL) BY MOUTH DAILY.   No facility-administered encounter medications on file as of 05/26/2018.     Allergies: Penicillins  Body mass index is 24.16 kg/m.  Blood pressure 135/77, pulse 75, height 4\' 10"  (1.473 m), weight 115 lb 9.6 oz (52.4 kg), SpO2 98 %.   Review of Systems  Constitutional: Negative for activity change, appetite change, chills, diaphoresis, fever and unexpected weight change.  HENT: Negative for congestion.   Eyes: Negative for visual disturbance.  Respiratory: Negative for cough, chest tightness, shortness of breath, wheezing and stridor.   Cardiovascular: Negative for chest pain, palpitations and leg swelling.  Gastrointestinal: Negative for abdominal distention, abdominal pain, constipation, diarrhea, nausea and vomiting.  Endocrine: Negative for cold intolerance, heat intolerance, polydipsia, polyphagia and polyuria.  Genitourinary: Negative for difficulty urinating, flank pain and hematuria.  Musculoskeletal: Positive for arthralgias, joint swelling and myalgias. Negative for back pain, gait problem, neck pain and neck stiffness.  Skin: Negative for color change, pallor, rash and wound.  Allergic/Immunologic: Negative for immunocompromised state.  Neurological: Negative for dizziness, tremors, weakness and headaches.  Hematological: Does not bruise/bleed easily.  Psychiatric/Behavioral: Negative for confusion, dysphoric mood, self-injury, sleep disturbance and suicidal ideas. The patient is not nervous/anxious and is not hyperactive.        Objective:    Physical Exam  Constitutional: She is oriented to person, place, and time. She appears well-developed and well-nourished. No distress.  HENT:  Head: Normocephalic and atraumatic.  Right Ear: External ear normal. Decreased hearing is noted.  Left Ear: External ear normal. Decreased hearing is noted.  Nose: Right sinus exhibits no maxillary sinus tenderness and no frontal sinus tenderness. Left sinus exhibits no maxillary sinus tenderness and no frontal sinus tenderness.  Mouth/Throat: Uvula is midline. Abnormal dentition.  Missing a few teeth, yet no active tooth decay noted.  Cardiovascular: Normal rate, regular rhythm and intact distal pulses.  Murmur heard. Pulmonary/Chest: Effort normal and breath sounds normal. No respiratory distress. She has no wheezes. She has no rales. She exhibits no tenderness.  Musculoskeletal: She exhibits edema and deformity.       Right ankle: She exhibits swelling. She exhibits normal range of motion and normal pulse.       Left ankle: She exhibits normal range of motion, no swelling and normal pulse.       Lumbar back: She exhibits tenderness.  Thoracic scoliosis.   Neurological: She is alert and oriented to person, place, and time. Coordination normal.  Skin: Skin is warm and dry. No rash noted.  She is not diaphoretic. No erythema. No pallor.  Psychiatric: She has a normal mood and affect. Her behavior is normal. Judgment and thought content normal.  Nursing note and vitals reviewed.     Assessment & Plan:   1. Healthcare maintenance   2. Secondary hypertension   3. Essential hypertension   4. Hypothyroidism, unspecified type   5. Vitamin D deficiency   6. Elevated LDL cholesterol level     Hypertension BP at goal 135/77, HR 75 Continue Lisinopril 20mg  QD, HCTZ 25mg  QD K+ 4.6  Healthcare maintenance Continue all medications as directed. Continue to drink plenty of water and follow heart health diet. Continue to remain as active as  possible. Your recent lab results are very good, excellent reduction of LDL cholesterol. Recommend regular follow-up in 6 months, will recheck CMP  Hypothyroidism TSH 1.850 Continue Levothyroxine QD  Vitamin D deficiency Continue once daily Vit d 2000 QD   Elevated LDL cholesterol level LDL 161, last yr 136- reduced naturally by eliminating peanut butter int diet  Will monitor annually     FOLLOW-UP:  No follow-ups on file.

## 2018-05-26 ENCOUNTER — Encounter: Payer: Self-pay | Admitting: Adult Health

## 2018-05-26 ENCOUNTER — Ambulatory Visit (INDEPENDENT_AMBULATORY_CARE_PROVIDER_SITE_OTHER): Payer: Medicare Other | Admitting: Adult Health

## 2018-05-26 VITALS — BP 135/77 | HR 75 | Ht <= 58 in | Wt 115.6 lb

## 2018-05-26 DIAGNOSIS — Z0000 Encounter for general adult medical examination without abnormal findings: Secondary | ICD-10-CM

## 2018-05-26 DIAGNOSIS — E039 Hypothyroidism, unspecified: Secondary | ICD-10-CM | POA: Diagnosis not present

## 2018-05-26 DIAGNOSIS — I10 Essential (primary) hypertension: Secondary | ICD-10-CM

## 2018-05-26 DIAGNOSIS — E559 Vitamin D deficiency, unspecified: Secondary | ICD-10-CM

## 2018-05-26 DIAGNOSIS — I159 Secondary hypertension, unspecified: Secondary | ICD-10-CM

## 2018-05-26 DIAGNOSIS — E7800 Pure hypercholesterolemia, unspecified: Secondary | ICD-10-CM | POA: Diagnosis not present

## 2018-05-26 NOTE — Assessment & Plan Note (Addendum)
LDL 117, last yr 136- reduced naturally by eliminating peanut butter int diet  Will monitor annually

## 2018-05-26 NOTE — Assessment & Plan Note (Signed)
Continue once daily Vit d 2000 QD

## 2018-05-26 NOTE — Assessment & Plan Note (Addendum)
Continue all medications as directed. Continue to drink plenty of water and follow heart health diet. Continue to remain as active as possible. Your recent lab results are very good, excellent reduction of LDL cholesterol. Recommend regular follow-up in 6 months, will recheck CMP

## 2018-05-26 NOTE — Assessment & Plan Note (Signed)
TSH 1.850 Continue Levothyroxine QD

## 2018-05-26 NOTE — Assessment & Plan Note (Signed)
BP at goal 135/77, HR 75 Continue Lisinopril 20mg  QD, HCTZ 25mg  QD K+ 4.6

## 2018-05-26 NOTE — Patient Instructions (Addendum)
Preventive Care for Adults, Female  A healthy lifestyle and preventive care can promote health and wellness. Preventive health guidelines for women include the following key practices.   A routine yearly physical is a good way to check with your health care provider about your health and preventive screening. It is a chance to share any concerns and updates on your health and to receive a thorough exam.   Visit your dentist for a routine exam and preventive care every 6 months. Brush your teeth twice a day and floss once a day. Good oral hygiene prevents tooth decay and gum disease.   The frequency of eye exams is based on your age, health, family medical history, use of contact lenses, and other factors. Follow your health care provider's recommendations for frequency of eye exams.   Eat a healthy diet. Foods like vegetables, fruits, whole grains, low-fat dairy products, and lean protein foods contain the nutrients you need without too many calories. Decrease your intake of foods high in solid fats, added sugars, and salt. Eat the right amount of calories for you.Get information about a proper diet from your health care provider, if necessary.   Regular physical exercise is one of the most important things you can do for your health. Most adults should get at least 150 minutes of moderate-intensity exercise (any activity that increases your heart rate and causes you to sweat) each week. In addition, most adults need muscle-strengthening exercises on 2 or more days a week.   Maintain a healthy weight. The body mass index (BMI) is a screening tool to identify possible weight problems. It provides an estimate of body fat based on height and weight. Your health care provider can find your BMI, and can help you achieve or maintain a healthy weight.For adults 20 years and older:   - A BMI below 18.5 is considered underweight.   - A BMI of 18.5 to 24.9 is normal.   - A BMI of 25 to 29.9 is  considered overweight.   - A BMI of 30 and above is considered obese.   Maintain normal blood lipids and cholesterol levels by exercising and minimizing your intake of trans and saturated fats.  Eat a balanced diet with plenty of fruit and vegetables. Blood tests for lipids and cholesterol should begin at age 20 and be repeated every 5 years minimum.  If your lipid or cholesterol levels are high, you are over 40, or you are at high risk for heart disease, you may need your cholesterol levels checked more frequently.Ongoing high lipid and cholesterol levels should be treated with medicines if diet and exercise are not working.   If you smoke, find out from your health care provider how to quit. If you do not use tobacco, do not start.   Lung cancer screening is recommended for adults aged 55-80 years who are at high risk for developing lung cancer because of a history of smoking. A yearly low-dose CT scan of the lungs is recommended for people who have at least a 30-pack-year history of smoking and are a current smoker or have quit within the past 15 years. A pack year of smoking is smoking an average of 1 pack of cigarettes a day for 1 year (for example: 1 pack a day for 30 years or 2 packs a day for 15 years). Yearly screening should continue until the smoker has stopped smoking for at least 15 years. Yearly screening should be stopped for people who develop a   health problem that would prevent them from having lung cancer treatment.   If you are pregnant, do not drink alcohol. If you are breastfeeding, be very cautious about drinking alcohol. If you are not pregnant and choose to drink alcohol, do not have more than 1 drink per day. One drink is considered to be 12 ounces (355 mL) of beer, 5 ounces (148 mL) of wine, or 1.5 ounces (44 mL) of liquor.   Avoid use of street drugs. Do not share needles with anyone. Ask for help if you need support or instructions about stopping the use of  drugs.   High blood pressure causes heart disease and increases the risk of stroke. Your blood pressure should be checked at least yearly.  Ongoing high blood pressure should be treated with medicines if weight loss and exercise do not work.   If you are 69-55 years old, ask your health care provider if you should take aspirin to prevent strokes.   Diabetes screening involves taking a blood sample to check your fasting blood sugar level. This should be done once every 3 years, after age 38, if you are within normal weight and without risk factors for diabetes. Testing should be considered at a younger age or be carried out more frequently if you are overweight and have at least 1 risk factor for diabetes.   Breast cancer screening is essential preventive care for women. You should practice "breast self-awareness."  This means understanding the normal appearance and feel of your breasts and may include breast self-examination.  Any changes detected, no matter how small, should be reported to a health care provider.  Women in their 80s and 30s should have a clinical breast exam (CBE) by a health care provider as part of a regular health exam every 1 to 3 years.  After age 66, women should have a CBE every year.  Starting at age 1, women should consider having a mammogram (breast X-ray test) every year.  Women who have a family history of breast cancer should talk to their health care provider about genetic screening.  Women at a high risk of breast cancer should talk to their health care providers about having an MRI and a mammogram every year.   -Breast cancer gene (BRCA)-related cancer risk assessment is recommended for women who have family members with BRCA-related cancers. BRCA-related cancers include breast, ovarian, tubal, and peritoneal cancers. Having family members with these cancers may be associated with an increased risk for harmful changes (mutations) in the breast cancer genes BRCA1 and  BRCA2. Results of the assessment will determine the need for genetic counseling and BRCA1 and BRCA2 testing.   The Pap test is a screening test for cervical cancer. A Pap test can show cell changes on the cervix that might become cervical cancer if left untreated. A Pap test is a procedure in which cells are obtained and examined from the lower end of the uterus (cervix).   - Women should have a Pap test starting at age 57.   - Between ages 90 and 70, Pap tests should be repeated every 2 years.   - Beginning at age 63, you should have a Pap test every 3 years as long as the past 3 Pap tests have been normal.   - Some women have medical problems that increase the chance of getting cervical cancer. Talk to your health care provider about these problems. It is especially important to talk to your health care provider if a  new problem develops soon after your last Pap test. In these cases, your health care provider may recommend more frequent screening and Pap tests.   - The above recommendations are the same for women who have or have not gotten the vaccine for human papillomavirus (HPV).   - If you had a hysterectomy for a problem that was not cancer or a condition that could lead to cancer, then you no longer need Pap tests. Even if you no longer need a Pap test, a regular exam is a good idea to make sure no other problems are starting.   - If you are between ages 36 and 66 years, and you have had normal Pap tests going back 10 years, you no longer need Pap tests. Even if you no longer need a Pap test, a regular exam is a good idea to make sure no other problems are starting.   - If you have had past treatment for cervical cancer or a condition that could lead to cancer, you need Pap tests and screening for cancer for at least 20 years after your treatment.   - If Pap tests have been discontinued, risk factors (such as a new sexual partner) need to be reassessed to determine if screening should  be resumed.   - The HPV test is an additional test that may be used for cervical cancer screening. The HPV test looks for the virus that can cause the cell changes on the cervix. The cells collected during the Pap test can be tested for HPV. The HPV test could be used to screen women aged 70 years and older, and should be used in women of any age who have unclear Pap test results. After the age of 67, women should have HPV testing at the same frequency as a Pap test.   Colorectal cancer can be detected and often prevented. Most routine colorectal cancer screening begins at the age of 57 years and continues through age 26 years. However, your health care provider may recommend screening at an earlier age if you have risk factors for colon cancer. On a yearly basis, your health care provider may provide home test kits to check for hidden blood in the stool.  Use of a small camera at the end of a tube, to directly examine the colon (sigmoidoscopy or colonoscopy), can detect the earliest forms of colorectal cancer. Talk to your health care provider about this at age 23, when routine screening begins. Direct exam of the colon should be repeated every 5 -10 years through age 49 years, unless early forms of pre-cancerous polyps or small growths are found.   People who are at an increased risk for hepatitis B should be screened for this virus. You are considered at high risk for hepatitis B if:  -You were born in a country where hepatitis B occurs often. Talk with your health care provider about which countries are considered high risk.  - Your parents were born in a high-risk country and you have not received a shot to protect against hepatitis B (hepatitis B vaccine).  - You have HIV or AIDS.  - You use needles to inject street drugs.  - You live with, or have sex with, someone who has Hepatitis B.  - You get hemodialysis treatment.  - You take certain medicines for conditions like cancer, organ  transplantation, and autoimmune conditions.   Hepatitis C blood testing is recommended for all people born from [birth year] through 1965 and any individual  with known risks for hepatitis C.   Practice safe sex. Use condoms and avoid high-risk sexual practices to reduce the spread of sexually transmitted infections (STIs). STIs include gonorrhea, chlamydia, syphilis, trichomonas, herpes, HPV, and human immunodeficiency virus (HIV). Herpes, HIV, and HPV are viral illnesses that have no cure. They can result in disability, cancer, and death. Sexually active women aged 25 years and younger should be checked for chlamydia. Older women with new or multiple partners should also be tested for chlamydia. Testing for other STIs is recommended if you are sexually active and at increased risk.   Osteoporosis is a disease in which the bones lose minerals and strength with aging. This can result in serious bone fractures or breaks. The risk of osteoporosis can be identified using a bone density scan. Women ages 65 years and over and women at risk for fractures or osteoporosis should discuss screening with their health care providers. Ask your health care provider whether you should take a calcium supplement or vitamin D to There are also several preventive steps women can take to avoid osteoporosis and resulting fractures or to keep osteoporosis from worsening. -->Recommendations include:  Eat a balanced diet high in fruits, vegetables, calcium, and vitamins.  Get enough calcium. The recommended total intake of is 1,200 mg daily; for best absorption, if taking supplements, divide doses into 250-500 mg doses throughout the day. Of the two types of calcium, calcium carbonate is best absorbed when taken with food but calcium citrate can be taken on an empty stomach.  Get enough vitamin D. NAMS and the National Osteoporosis Foundation recommend at least 1,000 IU per day for women age 50 and over who are at risk of vitamin D  deficiency. Vitamin D deficiency can be caused by inadequate sun exposure (for example, those who live in northern latitudes).  Avoid alcohol and smoking. Heavy alcohol intake (more than 7 drinks per week) increases the risk of falls and hip fracture and women smokers tend to lose bone more rapidly and have lower bone mass than nonsmokers. Stopping smoking is one of the most important changes women can make to improve their health and decrease risk for disease.  Be physically active every day. Weight-bearing exercise (for example, fast walking, hiking, jogging, and weight training) may strengthen bones or slow the rate of bone loss that comes with aging. Balancing and muscle-strengthening exercises can reduce the risk of falling and fracture.  Consider therapeutic medications. Currently, several types of effective drugs are available. Healthcare providers can recommend the type most appropriate for each woman.  Eliminate environmental factors that may contribute to accidents. Falls cause nearly 90% of all osteoporotic fractures, so reducing this risk is an important bone-health strategy. Measures include ample lighting, removing obstructions to walking, using nonskid rugs on floors, and placing mats and/or grab bars in showers.  Be aware of medication side effects. Some common medicines make bones weaker. These include a type of steroid drug called glucocorticoids used for arthritis and asthma, some antiseizure drugs, certain sleeping pills, treatments for endometriosis, and some cancer drugs. An overactive thyroid gland or using too much thyroid hormone for an underactive thyroid can also be a problem. If you are taking these medicines, talk to your doctor about what you can do to help protect your bones.reduce the rate of osteoporosis.    Menopause can be associated with physical symptoms and risks. Hormone replacement therapy is available to decrease symptoms and risks. You should talk to your  health care provider   about whether hormone replacement therapy is right for you.   Use sunscreen. Apply sunscreen liberally and repeatedly throughout the day. You should seek shade when your shadow is shorter than you. Protect yourself by wearing long sleeves, pants, a wide-brimmed hat, and sunglasses year round, whenever you are outdoors.   Once a month, do a whole body skin exam, using a mirror to look at the skin on your back. Tell your health care provider of new moles, moles that have irregular borders, moles that are larger than a pencil eraser, or moles that have changed in shape or color.   -Stay current with required vaccines (immunizations).   Influenza vaccine. All adults should be immunized every year.  Tetanus, diphtheria, and acellular pertussis (Td, Tdap) vaccine. Pregnant women should receive 1 dose of Tdap vaccine during each pregnancy. The dose should be obtained regardless of the length of time since the last dose. Immunization is preferred during the 27th 36th week of gestation. An adult who has not previously received Tdap or who does not know her vaccine status should receive 1 dose of Tdap. This initial dose should be followed by tetanus and diphtheria toxoids (Td) booster doses every 10 years. Adults with an unknown or incomplete history of completing a 3-dose immunization series with Td-containing vaccines should begin or complete a primary immunization series including a Tdap dose. Adults should receive a Td booster every 10 years.  Varicella vaccine. An adult without evidence of immunity to varicella should receive 2 doses or a second dose if she has previously received 1 dose. Pregnant females who do not have evidence of immunity should receive the first dose after pregnancy. This first dose should be obtained before leaving the health care facility. The second dose should be obtained 4 8 weeks after the first dose.  Human papillomavirus (HPV) vaccine. Females aged 13 26  years who have not received the vaccine previously should obtain the 3-dose series. The vaccine is not recommended for use in pregnant females. However, pregnancy testing is not needed before receiving a dose. If a female is found to be pregnant after receiving a dose, no treatment is needed. In that case, the remaining doses should be delayed until after the pregnancy. Immunization is recommended for any person with an immunocompromised condition through the age of 26 years if she did not get any or all doses earlier. During the 3-dose series, the second dose should be obtained 4 8 weeks after the first dose. The third dose should be obtained 24 weeks after the first dose and 16 weeks after the second dose.  Zoster vaccine. One dose is recommended for adults aged 60 years or older unless certain conditions are present.  Measles, mumps, and rubella (MMR) vaccine. Adults born before 1957 generally are considered immune to measles and mumps. Adults born in 1957 or later should have 1 or more doses of MMR vaccine unless there is a contraindication to the vaccine or there is laboratory evidence of immunity to each of the three diseases. A routine second dose of MMR vaccine should be obtained at least 28 days after the first dose for students attending postsecondary schools, health care workers, or international travelers. People who received inactivated measles vaccine or an unknown type of measles vaccine during 1963 1967 should receive 2 doses of MMR vaccine. People who received inactivated mumps vaccine or an unknown type of mumps vaccine before 1979 and are at high risk for mumps infection should consider immunization with 2 doses of   MMR vaccine. For females of childbearing age, rubella immunity should be determined. If there is no evidence of immunity, females who are not pregnant should be vaccinated. If there is no evidence of immunity, females who are pregnant should delay immunization until after pregnancy.  Unvaccinated health care workers born before [birth year] who lack laboratory evidence of measles, mumps, or rubella immunity or laboratory confirmation of disease should consider measles and mumps immunization with 2 doses of MMR vaccine or rubella immunization with 1 dose of MMR vaccine.  Pneumococcal 13-valent conjugate (PCV13) vaccine. When indicated, a person who is uncertain of her immunization history and has no record of immunization should receive the PCV13 vaccine. An adult aged 54 years or older who has certain medical conditions and has not been previously immunized should receive 1 dose of PCV13 vaccine. This PCV13 should be followed with a dose of pneumococcal polysaccharide (PPSV23) vaccine. The PPSV23 vaccine dose should be obtained at least 8 weeks after the dose of PCV13 vaccine. An adult aged 58 years or older who has certain medical conditions and previously received 1 or more doses of PPSV23 vaccine should receive 1 dose of PCV13. The PCV13 vaccine dose should be obtained 1 or more years after the last PPSV23 vaccine dose.  Pneumococcal polysaccharide (PPSV23) vaccine. When PCV13 is also indicated, PCV13 should be obtained first. All adults aged 58 years and older should be immunized. An adult younger than age 65 years who has certain medical conditions should be immunized. Any person who resides in a nursing home or long-term care facility should be immunized. An adult smoker should be immunized. People with an immunocompromised condition and certain other conditions should receive both PCV13 and PPSV23 vaccines. People with human immunodeficiency virus (HIV) infection should be immunized as soon as possible after diagnosis. Immunization during chemotherapy or radiation therapy should be avoided. Routine use of PPSV23 vaccine is not recommended for American Indians, [place] Natives, or people younger than 65 years unless there are medical conditions that require PPSV23 vaccine. When indicated,  people who have unknown immunization and have no record of immunization should receive PPSV23 vaccine. One-time revaccination 5 years after the first dose of PPSV23 is recommended for people aged 70 64 years who have chronic kidney failure, nephrotic syndrome, asplenia, or immunocompromised conditions. People who received 1 2 doses of PPSV23 before age 32 years should receive another dose of PPSV23 vaccine at age 96 years or later if at least 5 years have passed since the previous dose. Doses of PPSV23 are not needed for people immunized with PPSV23 at or after age 55 years.  Meningococcal vaccine. Adults with asplenia or persistent complement component deficiencies should receive 2 doses of quadrivalent meningococcal conjugate (MenACWY-D) vaccine. The doses should be obtained at least 2 months apart. Microbiologists working with certain meningococcal bacteria, [employer] recruits, people at risk during an outbreak, and people who travel to or live in countries with a high rate of meningitis should be immunized. A first-year college student up through age 58 years who is living in a residence hall should receive a dose if she did not receive a dose on or after her 16th birthday. Adults who have certain high-risk conditions should receive one or more doses of vaccine.  Hepatitis A vaccine. Adults who wish to be protected from this disease, have certain high-risk conditions, work with hepatitis A-infected animals, work in hepatitis A research labs, or travel to or work in countries with a high rate of hepatitis A should be  immunized. Adults who were previously unvaccinated and who anticipate close contact with an international adoptee during the first 60 days after arrival in the [place] from a country with a high rate of hepatitis A should be immunized.  Hepatitis B vaccine.  Adults who wish to be protected from this disease, have certain high-risk conditions, may be exposed to blood or other infectious  body fluids, are household contacts or sex partners of hepatitis B positive people, are clients or workers in certain care facilities, or travel to or work in countries with a high rate of hepatitis B should be immunized.  Haemophilus influenzae type b (Hib) vaccine. A previously unvaccinated person with asplenia or sickle cell disease or having a scheduled splenectomy should receive 1 dose of Hib vaccine. Regardless of previous immunization, a recipient of a hematopoietic stem cell transplant should receive a 3-dose series 6 12 months after her successful transplant. Hib vaccine is not recommended for adults with HIV infection.  Preventive Services / Frequency Ages 6 to 39years  Blood pressure check.** / Every 1 to 2 years.  Lipid and cholesterol check.** / Every 5 years beginning at age 39.  Clinical breast exam.** / Every 3 years for women in their 61s and 62s.  BRCA-related cancer risk assessment.** / For women who have family members with a BRCA-related cancer (breast, ovarian, tubal, or peritoneal cancers).  Pap test.** / Every 2 years from ages 47 through 85. Every 3 years starting at age 34 through age 12 or 74 with a history of 3 consecutive normal Pap tests.  HPV screening.** / Every 3 years from ages 46 through ages 43 to 54 with a history of 3 consecutive normal Pap tests.  Hepatitis C blood test.** / For any individual with known risks for hepatitis C.  Skin self-exam. / Monthly.  Influenza vaccine. / Every year.  Tetanus, diphtheria, and acellular pertussis (Tdap, Td) vaccine.** / Consult your health care provider. Pregnant women should receive 1 dose of Tdap vaccine during each pregnancy. 1 dose of Td every 10 years.  Varicella vaccine.** / Consult your health care provider. Pregnant females who do not have evidence of immunity should receive the first dose after pregnancy.  HPV vaccine. / 3 doses over 6 months, if 64 and younger. The vaccine is not recommended for use in  pregnant females. However, pregnancy testing is not needed before receiving a dose.  Measles, mumps, rubella (MMR) vaccine.** / You need at least 1 dose of MMR if you were born in 1957 or later. You may also need a 2nd dose. For females of childbearing age, rubella immunity should be determined. If there is no evidence of immunity, females who are not pregnant should be vaccinated. If there is no evidence of immunity, females who are pregnant should delay immunization until after pregnancy.  Pneumococcal 13-valent conjugate (PCV13) vaccine.** / Consult your health care provider.  Pneumococcal polysaccharide (PPSV23) vaccine.** / 1 to 2 doses if you smoke cigarettes or if you have certain conditions.  Meningococcal vaccine.** / 1 dose if you are age 71 to 37 years and a Market researcher living in a residence hall, or have one of several medical conditions, you need to get vaccinated against meningococcal disease. You may also need additional booster doses.  Hepatitis A vaccine.** / Consult your health care provider.  Hepatitis B vaccine.** / Consult your health care provider.  Haemophilus influenzae type b (Hib) vaccine.** / Consult your health care provider.  Ages 55 to 64years  Blood pressure check.** / Every 1 to 2 years.  Lipid and cholesterol check.** / Every 5 years beginning at age 20 years.  Lung cancer screening. / Every year if you are aged 55 80 years and have a 30-pack-year history of smoking and currently smoke or have quit within the past 15 years. Yearly screening is stopped once you have quit smoking for at least 15 years or develop a health problem that would prevent you from having lung cancer treatment.  Clinical breast exam.** / Every year after age 40 years.  BRCA-related cancer risk assessment.** / For women who have family members with a BRCA-related cancer (breast, ovarian, tubal, or peritoneal cancers).  Mammogram.** / Every year beginning at age 40  years and continuing for as long as you are in good health. Consult with your health care provider.  Pap test.** / Every 3 years starting at age 30 years through age 65 or 70 years with a history of 3 consecutive normal Pap tests.  HPV screening.** / Every 3 years from ages 30 years through ages 65 to 70 years with a history of 3 consecutive normal Pap tests.  Fecal occult blood test (FOBT) of stool. / Every year beginning at age 50 years and continuing until age 75 years. You may not need to do this test if you get a colonoscopy every 10 years.  Flexible sigmoidoscopy or colonoscopy.** / Every 5 years for a flexible sigmoidoscopy or every 10 years for a colonoscopy beginning at age 50 years and continuing until age 75 years.  Hepatitis C blood test.** / For all people born from 1945 through 1965 and any individual with known risks for hepatitis C.  Skin self-exam. / Monthly.  Influenza vaccine. / Every year.  Tetanus, diphtheria, and acellular pertussis (Tdap/Td) vaccine.** / Consult your health care provider. Pregnant women should receive 1 dose of Tdap vaccine during each pregnancy. 1 dose of Td every 10 years.  Varicella vaccine.** / Consult your health care provider. Pregnant females who do not have evidence of immunity should receive the first dose after pregnancy.  Zoster vaccine.** / 1 dose for adults aged 60 years or older.  Measles, mumps, rubella (MMR) vaccine.** / You need at least 1 dose of MMR if you were born in 1957 or later. You may also need a 2nd dose. For females of childbearing age, rubella immunity should be determined. If there is no evidence of immunity, females who are not pregnant should be vaccinated. If there is no evidence of immunity, females who are pregnant should delay immunization until after pregnancy.  Pneumococcal 13-valent conjugate (PCV13) vaccine.** / Consult your health care provider.  Pneumococcal polysaccharide (PPSV23) vaccine.** / 1 to 2 doses if  you smoke cigarettes or if you have certain conditions.  Meningococcal vaccine.** / Consult your health care provider.  Hepatitis A vaccine.** / Consult your health care provider.  Hepatitis B vaccine.** / Consult your health care provider.  Haemophilus influenzae type b (Hib) vaccine.** / Consult your health care provider.  Ages 65 years and over  Blood pressure check.** / Every 1 to 2 years.  Lipid and cholesterol check.** / Every 5 years beginning at age 20 years.  Lung cancer screening. / Every year if you are aged 55 80 years and have a 30-pack-year history of smoking and currently smoke or have quit within the past 15 years. Yearly screening is stopped once you have quit smoking for at least 15 years or develop a health problem that   would prevent you from having lung cancer treatment.  Clinical breast exam.** / Every year after age 103 years.  BRCA-related cancer risk assessment.** / For women who have family members with a BRCA-related cancer (breast, ovarian, tubal, or peritoneal cancers).  Mammogram.** / Every year beginning at age 36 years and continuing for as long as you are in good health. Consult with your health care provider.  Pap test.** / Every 3 years starting at age 5 years through age 85 or 10 years with 3 consecutive normal Pap tests. Testing can be stopped between 65 and 70 years with 3 consecutive normal Pap tests and no abnormal Pap or HPV tests in the past 10 years.  HPV screening.** / Every 3 years from ages 93 years through ages 70 or 45 years with a history of 3 consecutive normal Pap tests. Testing can be stopped between 65 and 70 years with 3 consecutive normal Pap tests and no abnormal Pap or HPV tests in the past 10 years.  Fecal occult blood test (FOBT) of stool. / Every year beginning at age 8 years and continuing until age 45 years. You may not need to do this test if you get a colonoscopy every 10 years.  Flexible sigmoidoscopy or colonoscopy.** /  Every 5 years for a flexible sigmoidoscopy or every 10 years for a colonoscopy beginning at age 69 years and continuing until age 68 years.  Hepatitis C blood test.** / For all people born from [birth year] through 1963 and any individual with known risks for hepatitis C.  Osteoporosis screening.** / A one-time screening for women ages 7 years and over and women at risk for fractures or osteoporosis.  Skin self-exam. / Monthly.  Influenza vaccine. / Every year.  Tetanus, diphtheria, and acellular pertussis (Tdap/Td) vaccine.** / 1 dose of Td every 10 years.  Varicella vaccine.** / Consult your health care provider.  Zoster vaccine.** / 1 dose for adults aged 5 years or older.  Pneumococcal 13-valent conjugate (PCV13) vaccine.** / Consult your health care provider.  Pneumococcal polysaccharide (PPSV23) vaccine.** / 1 dose for all adults aged 74 years and older.  Meningococcal vaccine.** / Consult your health care provider.  Hepatitis A vaccine.** / Consult your health care provider.  Hepatitis B vaccine.** / Consult your health care provider.  Haemophilus influenzae type b (Hib) vaccine.** / Consult your health care provider. ** Family history and personal history of risk and conditions may change your health care provider's recommendations. Document Released: 09/29/2001 Document Revised: 05/24/2013  [facility] Patient Information 2014 [place], [place].   EXERCISE AND DIET:  We recommended that you start or continue a regular exercise program for good health. Regular exercise means any activity that makes your heart beat faster and makes you sweat.  We recommend exercising at least 30 minutes per day at least 3 days a week, preferably 5.  We also recommend a diet low in fat and sugar / carbohydrates.  Inactivity, poor dietary choices and obesity can cause diabetes, heart attack, stroke, and kidney damage, among others.     ALCOHOL AND SMOKING:  Women should limit their alcohol intake to no  more than 7 drinks/beers/glasses of wine (combined, not each!) per week. Moderation of alcohol intake to this level decreases your risk of breast cancer and liver damage.  ( And of course, no recreational drugs are part of a healthy lifestyle.)  Also, you should not be smoking at all or even being exposed to second hand smoke. Most people know smoking can  cause cancer, and various heart and lung diseases, but did you know it also contributes to weakening of your bones?  Aging of your skin?  Yellowing of your teeth and nails?   CALCIUM AND VITAMIN D:  Adequate intake of calcium and Vitamin D are recommended.  The recommendations for exact amounts of these supplements seem to change often, but generally speaking 600 mg of calcium (either carbonate or citrate) and 800 units of Vitamin D per day seems prudent. Certain women may benefit from higher intake of Vitamin D.  If you are among these women, your doctor will have told you during your visit.     PAP SMEARS:  Pap smears, to check for cervical cancer or precancers,  have traditionally been done yearly, although recent scientific advances have shown that most women can have pap smears less often.  However, every woman still should have a physical exam from her gynecologist or primary care physician every year. It will include a breast check, inspection of the vulva and vagina to check for abnormal growths or skin changes, a visual exam of the cervix, and then an exam to evaluate the size and shape of the uterus and ovaries.  And after 82 years of age, a rectal exam is indicated to check for rectal cancers. We will also provide age appropriate advice regarding health maintenance, like when you should have certain vaccines, screening for sexually transmitted diseases, bone density testing, colonoscopy, mammograms, etc.    MAMMOGRAMS:  All women over 67 years old should have a yearly mammogram. Many facilities now offer a "3D" mammogram, which may cost  around $50 extra out of pocket. If possible,  we recommend you accept the option to have the 3D mammogram performed.  It both reduces the number of women who will be called back for extra views which then turn out to be normal, and it is better than the routine mammogram at detecting truly abnormal areas.     COLONOSCOPY:  Colonoscopy to screen for colon cancer is recommended for all women at age 90.  We know, you hate the idea of the prep.  We agree, BUT, having colon cancer and not knowing it is worse!!  Colon cancer so often starts as a polyp that can be seen and removed at colonscopy, which can quite literally save your life!  And if your first colonoscopy is normal and you have no family history of colon cancer, most women don't have to have it again for 10 years.  Once every ten years, you can do something that may end up saving your life, right?  We will be happy to help you get it scheduled when you are ready.  Be sure to check your insurance coverage so you understand how much it will cost.  It may be covered as a preventative service at no cost, but you should check your particular policy.   Continue all medications as directed. Continue to drink plenty of water and follow heart health diet. Continue to remain as active as possible. Your recent lab results are very good, excellent reduction of LDL cholesterol. Recommend regular follow-up in 6 months. YOU ARE DOING JUST GREAT! NICE TO SEE YOU!

## 2018-06-06 ENCOUNTER — Other Ambulatory Visit: Payer: Self-pay | Admitting: Adult Health

## 2018-07-18 ENCOUNTER — Other Ambulatory Visit: Payer: Self-pay | Admitting: Adult Health

## 2018-07-20 ENCOUNTER — Telehealth: Payer: Self-pay | Admitting: Adult Health

## 2018-07-20 NOTE — Telephone Encounter (Signed)
[name] who takes care of patient is requesting a call back from clinic staff about patient's mental status changes. She wants to discuss a few concerns and how to proceed. Please contact her at [phone]
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
## 2018-07-20 NOTE — Telephone Encounter (Signed)
In regards to contacting Marvis MoellerJeanette Thomas about patient's mental status, please disregard phone number in previous message and contact on her mobile phone, [phone]

## 2018-07-21 ENCOUNTER — Encounter: Payer: Self-pay | Admitting: Adult Health

## 2018-07-21 ENCOUNTER — Ambulatory Visit (HOSPITAL_COMMUNITY)
Admission: RE | Admit: 2018-07-21 | Discharge: 2018-07-21 | Disposition: A | Discharge status: Home / Self Care | Payer: Medicare Other | Source: Ambulatory Visit | Attending: Adult Health | Admitting: Adult Health

## 2018-07-21 ENCOUNTER — Ambulatory Visit (INDEPENDENT_AMBULATORY_CARE_PROVIDER_SITE_OTHER): Payer: Medicare Other | Admitting: Adult Health

## 2018-07-21 VITALS — BP 118/66 | HR 86 | Temp 97.9°F | Ht <= 58 in | Wt 117.0 lb

## 2018-07-21 DIAGNOSIS — I159 Secondary hypertension, unspecified: Secondary | ICD-10-CM | POA: Diagnosis not present

## 2018-07-21 DIAGNOSIS — R011 Cardiac murmur, unspecified: Secondary | ICD-10-CM | POA: Diagnosis not present

## 2018-07-21 DIAGNOSIS — R8290 Unspecified abnormal findings in urine: Secondary | ICD-10-CM

## 2018-07-21 DIAGNOSIS — R410 Disorientation, unspecified: Secondary | ICD-10-CM | POA: Diagnosis not present

## 2018-07-21 DIAGNOSIS — R4182 Altered mental status, unspecified: Secondary | ICD-10-CM | POA: Diagnosis not present

## 2018-07-21 LAB — POCT URINALYSIS DIPSTICK
Bilirubin, UA: NEGATIVE
Glucose, UA: NEGATIVE
Ketones, UA: NEGATIVE
NITRITE UA: NEGATIVE
PROTEIN UA: NEGATIVE
Spec Grav, UA: 1.02 (ref 1.010–1.025)
Urobilinogen, UA: 0.2 E.U./dL
pH, UA: 6 (ref 5.0–8.0)

## 2018-07-21 NOTE — Patient Instructions (Signed)
Confusion Confusion is the inability to think with your usual speed or clarity. Confusion may come on quickly or slowly over time. How quickly the confusion comes on depends on the cause. Confusion can be due to any number of causes. What are the causes?  Concussion, head injury, or head trauma.  Seizures.  Stroke.  Fever.  Brain tumor.  Age related decreased brain function (dementia).  Heightened emotional states like rage or terror.  Mental illness in which the person loses the ability to determine what is real and what is not (hallucinations).  Infections such as a urinary tract infection (UTI).  Toxic effects from alcohol, drugs, or prescription medicines.  Dehydration and an imbalance of salts in the body (electrolytes).  Lack of sleep.  Low blood sugar (diabetes).  Low levels of oxygen from conditions such as chronic lung disorders.  Drug interactions or other medicine side effects.  Nutritional deficiencies, especially niacin, thiamine, vitamin C, or vitamin B.  Sudden drop in body temperature (hypothermia).  Change in routine, such as when traveling or hospitalized. What are the signs or symptoms? People often describe their thinking as cloudy or unclear when they are confused. Confusion can also include feeling disoriented. That means you are unaware of where or who you are. You may also not know what the date or time is. If confused, you may also have difficulty paying attention, remembering, and making decisions. Some people also act aggressively when they are confused. How is this diagnosed? The medical evaluation of confusion may include:  Blood and urine tests.  X-rays.  Brain and nervous system tests.  Analyzing your brain waves (electroencephalogram or EEG).  Magnetic resonance imaging (MRI) of your head.  Computed tomography (CT) scan of your head.  Mental status tests in which your health care provider may ask many questions. Some of these  questions may seem silly or strange, but they are a very important test to help diagnose and treat confusion.  How is this treated? An admission to the hospital may not be needed, but a person with confusion should not be left alone. Stay with a family member or friend until the confusion clears. Avoid alcohol, pain relievers, or sedative drugs until you have fully recovered. Do not drive until directed by your health care provider. Follow these instructions at home: What family and friends can do:  To find out if someone is confused, ask the person to state his or her name, age, and the date. If the person is unsure or answers incorrectly, he or she is confused.  Always introduce yourself, no matter how well the person knows you.  Often remind the person of his or her location.  Place a calendar and clock near the confused person.  Help the person with his or her medicines. You may want to use a pill box, an alarm as a reminder, or give the person each dose as prescribed.  Talk about current events and plans for the day.  Try to keep the environment calm, quiet, and peaceful.  Make sure the person keeps follow-up visits with his or her health care provider.  How is this prevented? Ways to prevent confusion:  Avoid alcohol.  Eat a balanced diet.  Get enough sleep.  Take medicine only as directed by your health care provider.  Do not become isolated. Spend time with other people and make plans for your days.  Keep careful watch on your blood sugar levels if you are diabetic.  Get help right   away if:  You develop severe headaches, repeated vomiting, seizures, blackouts, or slurred speech.  There is increasing confusion, weakness, numbness, restlessness, or personality changes.  You develop a loss of balance, have marked dizziness, feel uncoordinated, or fall.  You have delusions, hallucinations, or develop severe anxiety.  Your family members think you need to be  rechecked. This information is not intended to replace advice given to you by your health care provider. Make sure you discuss any questions you have with your health care provider. Document Released: 09/10/2004 Document Revised: 02/21/2016 Document Reviewed: 09/08/2013 Elsevier Interactive Patient Education  [employer]2018 [employer]  Urinalysis is not suggestive of urinary track infection, will send for culture/sensitivity. Do not eat or drink until all imaging studies have been completed. We will call you when results are available. If you notice any stroke like symptoms- facial droop, slurring words, significant confusion, proceed to nearest Emergency Room. Follow-up in one week, sooner if needed.

## 2018-07-21 NOTE — Assessment & Plan Note (Signed)
UA: Blood- Trace Nit - Neg Leu - trace Not having urinary sx's, not started on ABX Specimen sent for urine C/S

## 2018-07-21 NOTE — Assessment & Plan Note (Signed)
BP at goal 118/66, HR 86 Currently on Lisinopril 20mg  QD, HCTZ 25mg  QD

## 2018-07-21 NOTE — Assessment & Plan Note (Addendum)
STAT CT Head WO IMPRESSION: No acute or reversible finding. Mild age related volume loss, less than often seen at this age. No evidence of old or acute focal insult. Urgent VAS US Carotid- will be completed tomorrow. Urinalysis is not suggestive of urinary track infection, will send for culture/sensitivity. Do not eat or drink until all imaging studies have been completed. We will call you when results are available. If you notice any stroke like symptoms- facial droop, slurring words, significant confusion, proceed to nearest Emergency Room. Follow-up in one week, sooner if needed.

## 2018-07-22 ENCOUNTER — Ambulatory Visit (HOSPITAL_COMMUNITY)
Admission: RE | Admit: 2018-07-22 | Discharge: 2018-07-22 | Disposition: A | Discharge status: Home / Self Care | Payer: Medicare Other | Source: Ambulatory Visit | Attending: Adult Health | Admitting: Adult Health

## 2018-07-22 DIAGNOSIS — R4182 Altered mental status, unspecified: Secondary | ICD-10-CM | POA: Insufficient documentation

## 2018-07-22 NOTE — Progress Notes (Addendum)
Carotid duplex completed.   Preliminary results in CV Proc.    Krystal Johns 07/22/2018 8:43 AM

## 2018-07-25 LAB — CULTURE, URINE COMPREHENSIVE
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
## 2018-07-27 ENCOUNTER — Encounter: Payer: Self-pay | Admitting: Adult Health

## 2018-07-27 ENCOUNTER — Ambulatory Visit (INDEPENDENT_AMBULATORY_CARE_PROVIDER_SITE_OTHER): Payer: Medicare Other | Admitting: Adult Health

## 2018-07-27 VITALS — BP 114/83 | HR 71 | Temp 97.7°F | Ht <= 58 in | Wt 118.1 lb

## 2018-07-27 DIAGNOSIS — R4182 Altered mental status, unspecified: Secondary | ICD-10-CM

## 2018-07-27 NOTE — Assessment & Plan Note (Signed)
No other acute occurrences CT and Carotid Ultrasound were both stable. Continue all medications as directed. Continue to drink plenty of water and follow a heart healthy diet. Please call with any questions/concerns. Follow-up in 6 months, sooner if needed.

## 2018-07-27 NOTE — Patient Instructions (Addendum)

## 2018-11-24 ENCOUNTER — Ambulatory Visit: Payer: Medicare Other | Admitting: Adult Health

## 2018-12-01 ENCOUNTER — Telehealth: Payer: Self-pay | Admitting: Adult Health

## 2018-12-01 NOTE — Telephone Encounter (Signed)
Patient's daughter/ Ms. [name] called states pt rcvd a bill for Lab charges for an A1c denied by Medicare for insufficient medical need documentation--- DOS is 10.3.19  ---Forwarding message to [facility] for billing review.  --glh

## 2018-12-06 ENCOUNTER — Other Ambulatory Visit: Payer: Self-pay | Admitting: Adult Health

## 2018-12-07 ENCOUNTER — Ambulatory Visit (INDEPENDENT_AMBULATORY_CARE_PROVIDER_SITE_OTHER): Payer: Medicare Other | Admitting: Adult Health

## 2018-12-07 ENCOUNTER — Encounter: Payer: Self-pay | Admitting: Adult Health

## 2018-12-07 ENCOUNTER — Other Ambulatory Visit: Payer: Self-pay

## 2018-12-07 VITALS — BP 114/67 | HR 75 | Ht <= 58 in | Wt 110.0 lb

## 2018-12-07 DIAGNOSIS — Z0000 Encounter for general adult medical examination without abnormal findings: Secondary | ICD-10-CM | POA: Diagnosis not present

## 2018-12-07 NOTE — Assessment & Plan Note (Signed)
Continue all medications as directed. Remain well hydrated and follow heart healthy diet  Remain as active as possible Continue to self isolate per COVID-19 Stay at Home Orders Keep regular f/u June 2020  I have personally reviewed and noted the following in the patient's chart:   . Medical and social history . Use of alcohol, tobacco or illicit drugs  . Current medications and supplements . Functional ability and status . Nutritional status . Physical activity . Advanced directives . List of other physicians . Hospitalizations, surgeries, and ER visits in previous 12 months . Vitals . Screenings to include cognitive, depression, and falls . Referrals and appointments  In addition, I have reviewed and discussed with patient certain preventive protocols, quality metrics, and best practice recommendations. A written personalized care plan for preventive services as well as general preventive health recommendations were provided to patient.   I discussed the assessment and treatment plan with the patient. The patient was provided an opportunity to ask questions and all were answered. The patient agreed with the plan and demonstrated an understanding of the instructions.   The patient was advised to call back or seek an in-person evaluation if the symptoms worsen or if the condition fails to improve as anticipated

## 2018-12-07 NOTE — Progress Notes (Signed)
Virtual Visit via Telephone Note  I connected with Krystal Johns on 12/07/18 at  1:45 PM EDT by telephone and verified that I am speaking with the correct person using two identifiers.   I discussed the limitations, risks, security and privacy concerns of performing an evaluation and management service by telephone and the availability of in person appointments. The staff discussed with the patient that there may be a patient responsible charge related to this service. The patient expressed understanding and agreed to proceed.  Location of Patient- Home Location of Provider- In Clinic  Subjective:   Krystal Johns is a 85 y.o. female who presents for Medicare Annual (Subsequent) preventive examination.  Review of Systems: General:   No F/C, wt loss Pulm:   No DIB, SOB, pleuritic chest pain Card:  No CP, palpitations Abd:  No n/v/d or pain Ext:  No inc edema from baseline       Objective:     Vitals: BP 114/67   Pulse 75   Ht  (1.473 m)   Wt 110 lb (49.9 kg)   BMI 22.99 kg/m   Body mass index is 22.99 kg/m.  Advanced Directives 02/24/2017  Does Patient Have a Medical Advance Directive? Yes    Tobacco Social History   Tobacco Use  Smoking Status Never Smoker  Smokeless Tobacco Never Used     Counseling given: Not Answered   Past Medical History:  Diagnosis Date  . Asymptomatic PVCs   . Diverticulitis of colon   . Hypertension   . Osteoporosis   . PAC (premature atrial contraction)    asymptomatic  . Thyroid disease   . Vitamin D deficiency    Past Surgical History:  Procedure Laterality Date  . STONE EXTRACTION WITH BASKET     Family History  Problem Relation Age of Onset  . Heart disease Mother   . Hypertension Mother   . Heart disease Father   . Cancer Brother    Social History   Socioeconomic History  . Marital status: Married    Spouse name: Not on file  . Number of children: Not on file  . Years of education: Not on file  . Highest  education level: Not on file  Occupational History  . Not on file  Social Needs  . Financial resource strain: Not on file  . Food insecurity:    Worry: Not on file    Inability: Not on file  . Transportation needs:    Medical: Not on file    Non-medical: Not on file  Tobacco Use  . Smoking status: Never Smoker  . Smokeless tobacco: Never Used  Substance and Sexual Activity  . Alcohol use: No  . Drug use: No  . Sexual activity: Never  Lifestyle  . Physical activity:    Days per week: Not on file    Minutes per session: Not on file  . Stress: Not on file  Relationships  . Social connections:    Talks on phone: Not on file    Gets together: Not on file    Attends religious service: Not on file    Active member of club or organization: Not on file    Attends meetings of clubs or organizations: Not on file    Relationship status: Not on file  Other Topics Concern  . Not on file  Social History Narrative  . Not on file    Outpatient Encounter Medications as of 12/07/2018  Medication Sig  . Cholecalciferol (VITAMIN  D3) 2000 units TABS Take by mouth daily.  . hydrochlorothiazide (HYDRODIURIL) 25 MG tablet TAKE 1 TABLET BY MOUTH DAILY.  Marland Kitchen. levothyroxine (SYNTHROID) 50 MCG tablet TAKE 1 TABLET BY MOUTH DAILY.  Marland Kitchen. lisinopril (PRINIVIL,ZESTRIL) 20 MG tablet TAKE 1 TABLET BY MOUTH DAILY.   No facility-administered encounter medications on file as of 12/07/2018.     Activities of Daily Living In your present state of health, do you have any difficulty performing the following activities: 12/07/2018  Hearing? Y  Vision? N  Difficulty concentrating or making decisions? N  Walking or climbing stairs? N  Dressing or bathing? N  Doing errands, shopping? Y  Some recent data might be hidden    Patient Care Team: Julaine Fusianford, Irvin Bastin D, NP as PCP - General (Family Medicine) Nadara Mustarduda, Marcus V, MD as Consulting Physician (Orthopedic Surgery)    Assessment:   This is a routine wellness  examination for Krystal Johns.  Exercise Activities and Dietary recommendations  She tries get up every hour and walk around the house She continues to perform house/yard   Fall Risk Fall Risk  12/07/2018 05/26/2018 02/24/2017 03/26/2015  Falls in the past year? 0 No Yes Yes  Number falls in past yr: - - 1 1  Injury with Fall? - - No Yes  Risk Factor Category  - - High Fall Risk -  Risk for fall due to : - - - History of fall(s)  Follow up - - Education provided -   Is the patient's home free of loose throw rugs in walkways, pet beds, electrical cords, etc?   yes      Grab bars in the bathroom? yes      Handrails on the stairs?   yes      Adequate lighting?   Yes  Timed Get Up and Go performed: N/A, encounter not performed in clinic  Depression Screen PHQ 2/9 Scores 12/07/2018 07/27/2018 05/26/2018 11/24/2017  PHQ - 2 Score 0 0 0 0  PHQ- 9 Score 0 0 0 0     Cognitive Function MMSE - Mini Mental State Exam 07/21/2018  Orientation to time 3  Orientation to Place 5  Registration 3  Attention/ Calculation 4  Recall 1  Language- name 2 objects 2  Language- repeat 1  Language- follow 3 step command 3  Language- read & follow direction 1  Write a sentence 1  Copy design 1  Total score 25     6CIT Screen 12/07/2018  What Year? 0 points  What month? 0 points  What time? 0 points  Count back from 20 0 points  Months in reverse 2 points  Repeat phrase 10 points  Total Score 12    Immunization History  Administered Date(s) Administered  . Pneumococcal Conjugate-13 03/26/2015  . Pneumococcal Polysaccharide-23 11/30/2003  . Tdap 04/26/1999, 01/16/2011, 11/06/2016  . Zoster 01/29/2010    Qualifies for Shingles Vaccine?yes  Screening Tests Health Maintenance  Topic Date Due  . DEXA SCAN  04/25/1990  . INFLUENZA VACCINE  03/18/2019  . TETANUS/TDAP  11/07/2026  . PNA vac Low Risk Adult  Completed  . MAMMOGRAM  Discontinued    Cancer Screenings: Lung: Low Dose CT Chest  recommended if Age 68-80 years, 30 pack-year currently smoking OR have quit w/in 15years. Patient does not qualify. Breast:  Up to date on Mammogram? Yes   Up to date of Bone Density/Dexa? Yes Colorectal: N/A, aged out  Additional Screenings: Hepatitis C Screening: Pt declined     Plan:  Continue all medications as directed. Remain well hydrated and follow heart healthy diet  Remain as active as possible Continue to self isolate per COVID-19 Stay at Home Orders Keep regular f/u June 2020  I have personally reviewed and noted the following in the patient's chart:   . Medical and social history . Use of alcohol, tobacco or illicit drugs  . Current medications and supplements . Functional ability and status . Nutritional status . Physical activity . Advanced directives . List of other physicians . Hospitalizations, surgeries, and ER visits in previous 12 months . Vitals . Screenings to include cognitive, depression, and falls . Referrals and appointments  In addition, I have reviewed and discussed with patient certain preventive protocols, quality metrics, and best practice recommendations. A written personalized care plan for preventive services as well as general preventive health recommendations were provided to patient.   I discussed the assessment and treatment plan with the patient. The patient was provided an opportunity to ask questions and all were answered. The patient agreed with the plan and demonstrated an understanding of the instructions.   The patient was advised to call back or seek an in-person evaluation if the symptoms worsen or if the condition fails to improve as anticipated.  I provided 22 minutes of non-face-to-face time during this encounter.   Julaine Fusi, NP  12/07/2018

## 2019-01-17 ENCOUNTER — Other Ambulatory Visit: Payer: Self-pay | Admitting: Adult Health

## 2019-01-26 ENCOUNTER — Ambulatory Visit: Payer: Medicare Other | Admitting: Adult Health

## 2019-02-02 ENCOUNTER — Ambulatory Visit (INDEPENDENT_AMBULATORY_CARE_PROVIDER_SITE_OTHER): Payer: Medicare Other | Admitting: Adult Health

## 2019-02-02 ENCOUNTER — Other Ambulatory Visit: Payer: Self-pay

## 2019-02-02 ENCOUNTER — Encounter: Payer: Self-pay | Admitting: Adult Health

## 2019-02-02 DIAGNOSIS — E039 Hypothyroidism, unspecified: Secondary | ICD-10-CM | POA: Diagnosis not present

## 2019-02-02 DIAGNOSIS — I159 Secondary hypertension, unspecified: Secondary | ICD-10-CM

## 2019-02-02 DIAGNOSIS — Z0000 Encounter for general adult medical examination without abnormal findings: Secondary | ICD-10-CM | POA: Diagnosis not present

## 2019-02-02 NOTE — Assessment & Plan Note (Signed)
Assessment and Plan: Continue all medications as directed. Remain well hydrated, follow Heart Healthy Diet Recommend light compression socks to help reduce lower ext edema  Follow Up Instructions: Oct or Nov OV with fasting labs  I discussed the assessment and treatment plan with the patient. The patient was provided an opportunity to ask questions and all were answered. The patient agreed with the plan and demonstrated an understanding of the instructions.   The patient was advised to call back or seek an in-person evaluation if the symptoms worsen or if the condition fails to improve as anticipated.

## 2019-02-02 NOTE — Assessment & Plan Note (Signed)
Stable TSH 05/2028 1.850 Currently on Levothyroxine 89mcg QD

## 2019-02-02 NOTE — Assessment & Plan Note (Addendum)
She reports home readings: SBP [phone] DBP 78-80s HR 70-80s She denies CP/dyspnea/dizziness/HA/dizziness Currently on Lisinopril 20mg  QD, HCTZ 25mg  QD 05/2018 CMP GFR 50, K+ 4.5

## 2019-02-02 NOTE — Progress Notes (Signed)
Virtual Visit via Telephone Note  I connected with Krystal Johns on 02/02/19 at 10:30 AM EDT by telephone and verified that I am speaking with the correct person using two identifiers.  Location: Patient: Home Provider: In Clinic   I discussed the limitations, risks, security and privacy concerns of performing an evaluation and management service by telephone and the availability of in person appointments. I also discussed with the patient that there may be a patient responsible charge related to this service. The patient expressed understanding and agreed to proceed.   History of Present Illness: Ms. Raglin calls in today for regular f/u: HLD, HTN, Thyroid Disease, Osteoporosis  She reports medication compliance, denies SE She reports home readings: SBP [phone] DBP 78-80s HR 70-80s She denies CP/dyspnea/dizziness/HA/dizziness She reports "sipping on water" throughout the day and follows a heart healthy diet. She has remained home since early March- family has provided all her groceries and other needs since then. She reports minor swelling in R ankle 3 weeks that resolved with nightly elevation for 3-4 nights. She denies previous R ankle injury. She denies current swelling/warmth/redness of R ankle She denies decreased pulse in R foot She denies numbness or tingling in R toes She denies swelling/pain in R calf  Her sister Adelina Mings was on call as well Patient Care Team    Relationship Specialty Notifications Start End  Mina Marble D, NP PCP - General Family Medicine  08/22/18   Newt Minion, MD Consulting Physician Orthopedic Surgery  02/24/17   Mammography, [facility]  Diagnostic Radiology  12/07/18     Patient Active Problem List   Diagnosis Date Noted  . Encounter for Medicare annual wellness exam 12/07/2018  . Altered mental status 07/21/2018  . Abnormal urinalysis 07/21/2018  . Elevated LDL cholesterol level 05/26/2018  . Healthcare maintenance 05/26/2017  . Olecranon  fracture, right, closed, initial encounter 11/09/2016  . Systolic murmur 23/76/2831  . Hypothyroidism 11/22/2014  . Hypertension 02/14/2011  . Osteoporosis 02/14/2011  . Vitamin D deficiency 02/14/2011     Past Medical History:  Diagnosis Date  . Asymptomatic PVCs   . Diverticulitis of colon   . Hypertension   . Osteoporosis   . PAC (premature atrial contraction)    asymptomatic  . Thyroid disease   . Vitamin D deficiency      Past Surgical History:  Procedure Laterality Date  . STONE EXTRACTION WITH BASKET       Family History  Problem Relation Age of Onset  . Heart disease Mother   . Hypertension Mother   . Heart disease Father   . Cancer Brother      Social History   Substance and Sexual Activity  Drug Use No     Social History   Substance and Sexual Activity  Alcohol Use No     Social History   Tobacco Use  Smoking Status Never Smoker  Smokeless Tobacco Never Used     Outpatient Encounter Medications as of 02/02/2019  Medication Sig  . Cholecalciferol (VITAMIN D3) 2000 units TABS Take by mouth daily.  . hydrochlorothiazide (HYDRODIURIL) 25 MG tablet TAKE 1 TABLET BY MOUTH DAILY.  Marland Kitchen levothyroxine (SYNTHROID) 50 MCG tablet TAKE 1 TABLET BY MOUTH DAILY.  Marland Kitchen lisinopril (ZESTRIL) 20 MG tablet TAKE 1 TABLET BY MOUTH DAILY.   No facility-administered encounter medications on file as of 02/02/2019.     Allergies: Penicillins  Body mass index is 22.99 kg/m.  Blood pressure (!) 141/84, pulse 72, temperature 98 F (36.7 C),  temperature source Tympanic, weight 110 lb (49.9 kg).    Observations/Objective: No acute distress noted during the telephone conversation. Sister was on the call as well.  Assessment and Plan: Continue all medications as directed. Remain well hydrated, follow Heart Healthy Diet Recommend light compression socks to help reduce lower ext edema   1. Secondary hypertension   2. Hypothyroidism, unspecified type   3.  Healthcare maintenance     Hypertension She reports home readings: SBP [phone] DBP 78-80s HR 70-80s She denies CP/dyspnea/dizziness/HA/dizziness Currently on Lisinopril 20mg  QD, HCTZ 25mg  QD 05/2018 CMP GFR 50, K+ 4.5  Hypothyroidism Stable TSH 05/2028 1.850 Currently on Levothyroxine 50mcg QD  Healthcare maintenance Assessment and Plan: Continue all medications as directed. Remain well hydrated, follow Heart Healthy Diet Recommend light compression socks to help reduce lower ext edema  Follow Up Instructions: Oct or Nov OV with fasting labs  I discussed the assessment and treatment plan with the patient. The patient was provided an opportunity to ask questions and all were answered. The patient agreed with the plan and demonstrated an understanding of the instructions.   The patient was advised to call back or seek an in-person evaluation if the symptoms worsen or if the condition fails to improve as anticipated.    FOLLOW-UP:  Return in about 4 months (around 06/04/2019) for HTN, Hypercholestermia, Fasting Labs.   Follow Up Instructions: Oct or Nov OV with fasting labs  I discussed the assessment and treatment plan with the patient. The patient was provided an opportunity to ask questions and all were answered. The patient agreed with the plan and demonstrated an understanding of the instructions.   The patient was advised to call back or seek an in-person evaluation if the symptoms worsen or if the condition fails to improve as anticipated.  I provided 15 minutes of non-face-to-face time during this encounter.   Julaine FusiKaty D , NP

## 2019-02-24 ENCOUNTER — Other Ambulatory Visit: Payer: Self-pay | Admitting: Adult Health

## 2019-03-08 ENCOUNTER — Other Ambulatory Visit: Payer: Self-pay | Admitting: Adult Health

## 2019-05-30 ENCOUNTER — Other Ambulatory Visit: Payer: Self-pay | Admitting: Adult Health

## 2019-05-31 ENCOUNTER — Other Ambulatory Visit: Payer: Self-pay | Admitting: Adult Health

## 2019-07-17 ENCOUNTER — Telehealth: Payer: Self-pay

## 2019-07-17 ENCOUNTER — Other Ambulatory Visit: Payer: Self-pay | Admitting: Adult Health

## 2019-07-17 NOTE — Telephone Encounter (Signed)
Please call pt's sister to schedule telemedicine visit for f/u.  No further refills until pt has this appt.  Charyl Bigger, CMA

## 2019-08-16 ENCOUNTER — Telehealth: Payer: Self-pay

## 2019-08-16 ENCOUNTER — Other Ambulatory Visit: Payer: Self-pay | Admitting: Adult Health

## 2019-08-16 NOTE — Telephone Encounter (Signed)
Please call pt to schedule f/u OV.  No further refills until pt is seen.  T. Nelson, CMA 

## 2019-08-30 ENCOUNTER — Ambulatory Visit (INDEPENDENT_AMBULATORY_CARE_PROVIDER_SITE_OTHER): Payer: Medicare Other | Admitting: Adult Health

## 2019-08-30 ENCOUNTER — Encounter: Payer: Self-pay | Admitting: Adult Health

## 2019-08-30 ENCOUNTER — Other Ambulatory Visit: Payer: Self-pay

## 2019-08-30 DIAGNOSIS — E039 Hypothyroidism, unspecified: Secondary | ICD-10-CM

## 2019-08-30 DIAGNOSIS — I159 Secondary hypertension, unspecified: Secondary | ICD-10-CM

## 2019-08-30 DIAGNOSIS — Z0000 Encounter for general adult medical examination without abnormal findings: Secondary | ICD-10-CM

## 2019-08-30 DIAGNOSIS — E7800 Pure hypercholesterolemia, unspecified: Secondary | ICD-10-CM

## 2019-08-30 MED ORDER — LEVOTHYROXINE SODIUM 50 MCG PO TABS: 50.0000 ug | ORAL_TABLET | Freq: Every day | ORAL | 0 refills | Status: DC

## 2019-08-30 MED ORDER — LISINOPRIL 20 MG PO TABS: 20.0000 mg | ORAL_TABLET | Freq: Every day | ORAL | 0 refills | Status: DC

## 2019-08-30 MED ORDER — HYDROCHLOROTHIAZIDE 25 MG PO TABS: 25.0000 mg | ORAL_TABLET | Freq: Every day | ORAL | 0 refills | Status: DC

## 2019-08-30 NOTE — Assessment & Plan Note (Signed)
  Continue current medications. She is currently on Lisinopril 20mg  QD, HCTZ 25mg  QD Her family infrequently checks her BP/HR- advised to check daily the next two weeks and call clinic with numbers for possible reduction in anti-hypertensives.

## 2019-08-30 NOTE — Progress Notes (Signed)
Virtual Visit via Telephone Note  I connected with Krystal Johns on 08/30/19 at  3:15 PM EST by telephone and verified that I am speaking with the correct person using two identifiers.  Location: Patient: Home Provider: In Clinic   I discussed the limitations, risks, security and privacy concerns of performing an evaluation and management service by telephone and the availability of in person appointments. I also discussed with the patient that there may be a patient responsible charge related to this service. The patient expressed understanding and agreed to proceed.   History of Present Illness: 02/02/2019 OV:  Krystal Johns calls in today for regular f/u: HLD, HTN, Thyroid Disease, Osteoporosis  She reports medication compliance, denies SE She reports home readings: SBP [phone] DBP 78-80s HR 70-80s She denies CP/dyspnea/dizziness/HA/dizziness She reports "sipping on water" throughout the day and follows a heart healthy diet. She has remained home since early March- family has provided all her groceries and other needs since then. She reports minor swelling in R ankle 3 weeks that resolved with nightly elevation for 3-4 nights. She denies previous R ankle injury. She denies current swelling/warmth/redness of R ankle She denies decreased pulse in R foot She denies numbness or tingling in R toes She denies swelling/pain in R calf  Her sister Krystal Johns was on call as well  08/30/2019 OV: Krystal Johns calls in today for regular f/u:  HLD, HTN, Thyroid Disease, Osteoporosis She reports home readings: SBP 110-130s DBP 78-80s HR 70-80s She denies CP/dyspnea/dizziness/HA/dizziness She is currently on Lisinopril 20mg  QD, HCTZ 25mg  QD Her family infrequently checks her BP/HR- advised to check daily the next two weeks and call clinic with numbers for possible reduction in anti-hypertensives. She reports reduction of swelling in lower extremity. She states "I have a great appetite", continues  to struggle with drinking enough water- encouraged to sip on water throughout the day. She denies difficulties with ADLs/IADLs She denies recent falls Family checks in on her daily. Sister was " " was on call  05/20/2019 labs reviewed Lipid- total 186 TGs-119 HDL045 LDL-117 TSH-WNL, 1.850 CMP-GFR 53 LFTs-WNL Lab Results  Component Value Date   HGBA1C 5.6 05/19/2018   Patient Care Team    Relationship Specialty Notifications Start End  07/20/2019 D, NP PCP - General Family Medicine  08/22/18   Krystal Hamburger, MD Consulting Physician Orthopedic Surgery  02/24/17   Mammography, [facility]  Diagnostic Radiology  12/07/18     Patient Active Problem List   Diagnosis Date Noted  . Encounter for Medicare annual wellness exam 12/07/2018  . Altered mental status 07/21/2018  . Abnormal urinalysis 07/21/2018  . Elevated LDL cholesterol level 05/26/2018  . Healthcare maintenance 05/26/2017  . Olecranon fracture, right, closed, initial encounter 11/09/2016  . Systolic murmur 11/22/2014  . Hypothyroidism 11/22/2014  . Hypertension 02/14/2011  . Osteoporosis 02/14/2011  . Vitamin D deficiency 02/14/2011     Past Medical History:  Diagnosis Date  . Asymptomatic PVCs   . Diverticulitis of colon   . Hypertension   . Osteoporosis   . PAC (premature atrial contraction)    asymptomatic  . Thyroid disease   . Vitamin D deficiency      Past Surgical History:  Procedure Laterality Date  . STONE EXTRACTION WITH BASKET       Family History  Problem Relation Age of Onset  . Heart disease Mother   . Hypertension Mother   . Heart disease Father   . Cancer Brother      Social  History   Substance and Sexual Activity  Drug Use No     Social History   Substance and Sexual Activity  Alcohol Use No     Social History   Tobacco Use  Smoking Status Never Smoker  Smokeless Tobacco Never Used     Outpatient Encounter Medications as of 08/30/2019  Medication Sig  .  Cholecalciferol (VITAMIN D3) 2000 units TABS Take by mouth daily.  . hydrochlorothiazide (HYDRODIURIL) 25 MG tablet Take 1 tablet (25 mg total) by mouth daily. OFFICE VISIT REQUIRED PRIOR TO ANY FURTHER REFILLS  . levothyroxine (SYNTHROID) 50 MCG tablet TAKE 1 TABLET BY MOUTH DAILY.  Marland Kitchen lisinopril (ZESTRIL) 20 MG tablet Take 1 tablet (20 mg total) by mouth daily. OFFICE VISIT REQUIRED PRIOR TO ANY FURTHER REFILLS!!   No facility-administered encounter medications on file as of 08/30/2019.    Allergies: Penicillins  Body mass index is 22.47 kg/m.  Blood pressure 118/73, pulse 72, temperature (!) 97.3 F (36.3 C), temperature source Oral, weight 107 lb 8 oz (48.8 kg). Review of Systems: General:   Denies fever, chills, unexplained weight loss.  Optho/Auditory:   Denies visual changes, blurred vision/LOV Respiratory:   Denies SOB, DOE more than baseline levels.  Cardiovascular:   Denies chest pain, palpitations, new onset peripheral edema  Gastrointestinal:   Denies nausea, vomiting, diarrhea.  Genitourinary: Denies dysuria, freq/ urgency, flank pain or discharge from genitals.  Endocrine:     Denies hot or cold intolerance, polyuria, polydipsia. Musculoskeletal:   Denies unexplained myalgias, joint swelling, unexplained arthralgias, gait problems.  Skin:  Denies rash, suspicious lesions Neurological:     Denies dizziness, unexplained weakness, numbness  Psychiatric/Behavioral:   Denies mood changes, suicidal or homicidal ideations, hallucinations    Observations/Objective: No acute distress noted.  Assessment and Plan: Continue current medications. She is currently on Lisinopril 20mg  QD, HCTZ 25mg  QD Her family infrequently checks her BP/HR- advised to check daily the next two weeks and call clinic with numbers for possible reduction in anti-hypertensives. Recommend 15 mins daily of mental stimulating activity to promote "mental/memory health"- ie: reading, suduko, work finds,  puzzles Continue to social distance and wear a mask when in public.  Follow Up Instructions: Call clinic in 2 weeks with BP/HR readings OV in 3 months   I discussed the assessment and treatment plan with the patient. The patient was provided an opportunity to ask questions and all were answered. The patient agreed with the plan and demonstrated an understanding of the instructions.   The patient was advised to call back or seek an in-person evaluation if the symptoms worsen or if the condition fails to improve as anticipated.  I provided 15  minutes of non-face-to-face time during this encounter.   Esaw Grandchild, NP

## 2019-08-30 NOTE — Assessment & Plan Note (Signed)
Lipids stable.

## 2019-08-30 NOTE — Assessment & Plan Note (Signed)
TSH stable Levothyroxine QD

## 2019-08-30 NOTE — Assessment & Plan Note (Signed)
Assessment and Plan: Continue current medications. She is currently on Lisinopril 20mg  QD, HCTZ 25mg  QD Her family infrequently checks her BP/HR- advised to check daily the next two weeks and call clinic with numbers for possible reduction in anti-hypertensives. Recommend 15 mins daily of mental stimulating activity to promote "mental/memory health"- ie: reading, suduko, work finds, puzzles Continue to social distance and wear a mask when in public.  Follow Up Instructions: Call clinic in 2 weeks with BP/HR readings OV in 3 months   I discussed the assessment and treatment plan with the patient. The patient was provided an opportunity to ask questions and all were answered. The patient agreed with the plan and demonstrated an understanding of the instructions.   The patient was advised to call back or seek an in-person evaluation if the symptoms worsen or if the condition fails to improve as anticipated.

## 2019-09-01 ENCOUNTER — Other Ambulatory Visit: Payer: Self-pay | Admitting: Adult Health

## 2019-09-01 ENCOUNTER — Telehealth: Payer: Self-pay | Admitting: Adult Health

## 2019-09-01 NOTE — Telephone Encounter (Signed)
Patient's family wishes to be advised if she should take COVID vaccine.  Pls call Marvis Moeller @ [phone].  -glh

## 2019-09-04 ENCOUNTER — Other Ambulatory Visit: Payer: Self-pay | Admitting: Adult Health

## 2019-09-04 MED ORDER — LISINOPRIL 10 MG PO TABS: 10.0000 mg | ORAL_TABLET | Freq: Every day | ORAL | 0 refills | Status: DC

## 2019-09-04 NOTE — Telephone Encounter (Signed)
LVM for pt's sister to call to discuss.  Tiajuana Amass, CMA

## 2019-09-04 NOTE — Telephone Encounter (Signed)
Please review and advise.  I am unsure if PCN allergy would exclude her from receiving the vaccine.  Tiajuana Amass, CMA

## 2019-09-04 NOTE — Telephone Encounter (Signed)
Good Morning [name], Krystal Johns has allergy to PCN- hives- she denies any breathing difficulties. She denies any anaphylactic reactions to medications/foods/insects. COVID-19 vaccine should be safe for her to take. Sincerely, Orpha Bur

## 2019-09-04 NOTE — Telephone Encounter (Signed)
Good Afternoon Tonya, Can you please call Ms. Harney- Due to lower BP Stop HCTZ Start reduced Lisinopril dosage- 10mg  QD (so please tell her to take 1/2 - 20mg  tablet). Please check BP/HR daily- record Call clinic in 2 weeks with new readings. Call clinic is BP consistently <100/60 or >160/100 or if she develops any cardiac sx's Thanks! 

## 2019-09-04 NOTE — Telephone Encounter (Signed)
Pt's sister, Ms. [name], informed.  Ms. Thomas expressed understanding.  She was given contact information to call to schedule appt for vaccine.  Pt's sister states that she checked pt's blood pressure this afternoon twice, first reading was 93/59 in left arm, and 109/66 in right arm.

## 2019-09-05 NOTE — Telephone Encounter (Signed)
Pt's sister, Ms. [name], informed.  Ms. Thomas repeated instructions to me and was able to verbal understanding.  Tiajuana Amass, CMA

## 2019-09-17 ENCOUNTER — Inpatient Hospital Stay (HOSPITAL_COMMUNITY): Payer: Medicare Other

## 2019-09-17 ENCOUNTER — Inpatient Hospital Stay (HOSPITAL_COMMUNITY)
Admission: EM | Admit: 2019-09-17 | Discharge: 2019-09-21 | DRG: 291 | Disposition: A | Discharge status: Hospice | Payer: Medicare Other | Attending: Internal Medicine | Admitting: Internal Medicine

## 2019-09-17 ENCOUNTER — Emergency Department (HOSPITAL_COMMUNITY): Payer: Medicare Other

## 2019-09-17 ENCOUNTER — Other Ambulatory Visit: Payer: Self-pay

## 2019-09-17 ENCOUNTER — Encounter (HOSPITAL_COMMUNITY): Payer: Self-pay

## 2019-09-17 DIAGNOSIS — M810 Age-related osteoporosis without current pathological fracture: Secondary | ICD-10-CM | POA: Diagnosis present

## 2019-09-17 DIAGNOSIS — I5041 Acute combined systolic (congestive) and diastolic (congestive) heart failure: Secondary | ICD-10-CM | POA: Diagnosis present

## 2019-09-17 DIAGNOSIS — Z8249 Family history of ischemic heart disease and other diseases of the circulatory system: Secondary | ICD-10-CM | POA: Diagnosis not present

## 2019-09-17 DIAGNOSIS — J189 Pneumonia, unspecified organism: Secondary | ICD-10-CM | POA: Diagnosis present

## 2019-09-17 DIAGNOSIS — J9601 Acute respiratory failure with hypoxia: Secondary | ICD-10-CM | POA: Diagnosis present

## 2019-09-17 DIAGNOSIS — I959 Hypotension, unspecified: Secondary | ICD-10-CM | POA: Diagnosis present

## 2019-09-17 DIAGNOSIS — R062 Wheezing: Secondary | ICD-10-CM | POA: Diagnosis not present

## 2019-09-17 DIAGNOSIS — I340 Nonrheumatic mitral (valve) insufficiency: Secondary | ICD-10-CM

## 2019-09-17 DIAGNOSIS — N179 Acute kidney failure, unspecified: Secondary | ICD-10-CM | POA: Diagnosis present

## 2019-09-17 DIAGNOSIS — I429 Cardiomyopathy, unspecified: Secondary | ICD-10-CM | POA: Diagnosis present

## 2019-09-17 DIAGNOSIS — Z20822 Contact with and (suspected) exposure to covid-19: Secondary | ICD-10-CM | POA: Diagnosis present

## 2019-09-17 DIAGNOSIS — R0902 Hypoxemia: Secondary | ICD-10-CM | POA: Diagnosis not present

## 2019-09-17 DIAGNOSIS — I10 Essential (primary) hypertension: Secondary | ICD-10-CM | POA: Diagnosis not present

## 2019-09-17 DIAGNOSIS — I351 Nonrheumatic aortic (valve) insufficiency: Secondary | ICD-10-CM | POA: Diagnosis not present

## 2019-09-17 DIAGNOSIS — E785 Hyperlipidemia, unspecified: Secondary | ICD-10-CM | POA: Diagnosis present

## 2019-09-17 DIAGNOSIS — E7800 Pure hypercholesterolemia, unspecified: Secondary | ICD-10-CM | POA: Diagnosis present

## 2019-09-17 DIAGNOSIS — E559 Vitamin D deficiency, unspecified: Secondary | ICD-10-CM | POA: Diagnosis present

## 2019-09-17 DIAGNOSIS — E039 Hypothyroidism, unspecified: Secondary | ICD-10-CM | POA: Diagnosis present

## 2019-09-17 DIAGNOSIS — Z79890 Hormone replacement therapy (postmenopausal): Secondary | ICD-10-CM

## 2019-09-17 DIAGNOSIS — I110 Hypertensive heart disease with heart failure: Principal | ICD-10-CM | POA: Diagnosis present

## 2019-09-17 DIAGNOSIS — Z515 Encounter for palliative care: Secondary | ICD-10-CM

## 2019-09-17 DIAGNOSIS — Z7189 Other specified counseling: Secondary | ICD-10-CM

## 2019-09-17 DIAGNOSIS — R0602 Shortness of breath: Secondary | ICD-10-CM | POA: Diagnosis not present

## 2019-09-17 DIAGNOSIS — Z66 Do not resuscitate: Secondary | ICD-10-CM | POA: Diagnosis not present

## 2019-09-17 DIAGNOSIS — Z209 Contact with and (suspected) exposure to unspecified communicable disease: Secondary | ICD-10-CM | POA: Diagnosis not present

## 2019-09-17 DIAGNOSIS — R000 Tachycardia, unspecified: Secondary | ICD-10-CM | POA: Diagnosis not present

## 2019-09-17 LAB — CBC WITH DIFFERENTIAL/PLATELET
Abs Immature Granulocytes: 0.05 10*3/uL (ref 0.00–0.07)
Basophils Absolute: 0.1 10*3/uL (ref 0.0–0.1)
Basophils Relative: 1 %
Eosinophils Absolute: 0 10*3/uL (ref 0.0–0.5)
Eosinophils Relative: 0 %
HCT: 39.4 % (ref 36.0–46.0)
Hemoglobin: 12.5 g/dL (ref 12.0–15.0)
Immature Granulocytes: 1 %
Lymphocytes Relative: 9 %
Lymphs Abs: 0.8 10*3/uL (ref 0.7–4.0)
MCH: 30.7 pg (ref 26.0–34.0)
MCHC: 31.7 g/dL (ref 30.0–36.0)
MCV: 96.8 fL (ref 80.0–100.0)
Monocytes Absolute: 0.6 10*3/uL (ref 0.1–1.0)
Monocytes Relative: 7 %
Neutro Abs: 7.5 10*3/uL (ref 1.7–7.7)
Neutrophils Relative %: 82 %
Platelets: 255 10*3/uL (ref 150–400)
RBC: 4.07 MIL/uL (ref 3.87–5.11)
RDW: 13.4 % (ref 11.5–15.5)
WBC: 9 10*3/uL (ref 4.0–10.5)
nRBC: 0 % (ref 0.0–0.2)

## 2019-09-17 LAB — ECHOCARDIOGRAM COMPLETE
Height: 63 in
Weight: 1680 oz

## 2019-09-17 LAB — RESPIRATORY PANEL BY RT PCR (FLU A&B, COVID)
Influenza A by PCR: NEGATIVE
Influenza B by PCR: NEGATIVE
SARS Coronavirus 2 by RT PCR: NEGATIVE

## 2019-09-17 LAB — BASIC METABOLIC PANEL
Anion gap: 12 (ref 5–15)
BUN: 25 mg/dL — ABNORMAL HIGH (ref 8–23)
CO2: 21 mmol/L — ABNORMAL LOW (ref 22–32)
Calcium: 8.8 mg/dL — ABNORMAL LOW (ref 8.9–10.3)
Chloride: 106 mmol/L (ref 98–111)
Creatinine, Ser: 1.21 mg/dL — ABNORMAL HIGH (ref 0.44–1.00)
GFR calc Af Amer: 44 mL/min — ABNORMAL LOW (ref >60)
GFR calc non Af Amer: 38 mL/min — ABNORMAL LOW (ref >60)
Glucose, Bld: 157 mg/dL — ABNORMAL HIGH (ref 70–99)
Potassium: 4.3 mmol/L (ref 3.5–5.1)
Sodium: 139 mmol/L (ref 135–145)

## 2019-09-17 LAB — TROPONIN I (HIGH SENSITIVITY): Troponin I (High Sensitivity): 61 ng/L — ABNORMAL HIGH (ref <18)

## 2019-09-17 LAB — BRAIN NATRIURETIC PEPTIDE: B Natriuretic Peptide: 19.4 pg/mL (ref 0.0–100.0)

## 2019-09-17 LAB — LACTIC ACID, PLASMA: Lactic Acid, Venous: 1.9 mmol/L (ref 0.5–1.9)

## 2019-09-17 LAB — SARS CORONAVIRUS 2 (TAT 6-24 HRS): SARS Coronavirus 2: NEGATIVE

## 2019-09-17 MED ORDER — VITAMIN D 25 MCG (1000 UNIT) PO TABS
2000.0000 [IU] | ORAL_TABLET | Freq: Every day | ORAL | Status: DC
  Administered 2019-09-17 – 2019-09-21 (×5): 2000 [IU] via ORAL
  Filled 2019-09-17 (×5): qty 2

## 2019-09-17 MED ORDER — ALBUTEROL SULFATE HFA 108 (90 BASE) MCG/ACT IN AERS
8.0000 | INHALATION_SPRAY | Freq: Once | RESPIRATORY_TRACT | Status: AC
  Administered 2019-09-17: 8 via RESPIRATORY_TRACT
  Filled 2019-09-17: qty 6.7

## 2019-09-17 MED ORDER — SODIUM CHLORIDE 0.9 % IV SOLN: 500.0000 mg | INTRAVENOUS | Status: DC

## 2019-09-17 MED ORDER — SODIUM CHLORIDE 0.9 % IV SOLN: INTRAVENOUS | Status: DC

## 2019-09-17 MED ORDER — METHYLPREDNISOLONE SODIUM SUCC 125 MG IJ SOLR
125.0000 mg | Freq: Once | INTRAMUSCULAR | Status: AC
  Administered 2019-09-17: 125 mg via INTRAVENOUS
  Filled 2019-09-17: qty 2

## 2019-09-17 MED ORDER — LEVOTHYROXINE SODIUM 50 MCG PO TABS
50.0000 ug | ORAL_TABLET | Freq: Every day | ORAL | Status: DC
  Administered 2019-09-17 – 2019-09-21 (×5): 50 ug via ORAL
  Filled 2019-09-17 (×5): qty 1

## 2019-09-17 MED ORDER — ENOXAPARIN SODIUM 30 MG/0.3ML ~~LOC~~ SOLN
30.0000 mg | Freq: Every day | SUBCUTANEOUS | Status: DC
  Administered 2019-09-17 – 2019-09-21 (×6): 30 mg via SUBCUTANEOUS
  Filled 2019-09-17 (×5): qty 0.3

## 2019-09-17 MED ORDER — SODIUM CHLORIDE 0.9 % IV SOLN
1.0000 g | Freq: Once | INTRAVENOUS | Status: AC
  Administered 2019-09-17: 1 g via INTRAVENOUS
  Filled 2019-09-17: qty 10

## 2019-09-17 MED ORDER — SODIUM CHLORIDE 0.9 % IV SOLN
1.0000 g | INTRAVENOUS | Status: DC
  Administered 2019-09-18 – 2019-09-21 (×4): 1 g via INTRAVENOUS
  Filled 2019-09-17: qty 1
  Filled 2019-09-17: qty 10
  Filled 2019-09-17 (×2): qty 1

## 2019-09-17 MED ORDER — LISINOPRIL 10 MG PO TABS: 10.0000 mg | ORAL_TABLET | Freq: Every day | ORAL | Status: DC

## 2019-09-17 MED ORDER — ACETAMINOPHEN 650 MG RE SUPP: 650.0000 mg | Freq: Four times a day (QID) | RECTAL | Status: DC | PRN

## 2019-09-17 MED ORDER — SODIUM CHLORIDE 0.9 % IV SOLN
500.0000 mg | INTRAVENOUS | Status: AC
  Administered 2019-09-18 – 2019-09-20 (×3): 500 mg via INTRAVENOUS
  Filled 2019-09-17 (×3): qty 500

## 2019-09-17 MED ORDER — ONDANSETRON HCL 4 MG PO TABS: 4.0000 mg | ORAL_TABLET | Freq: Four times a day (QID) | ORAL | Status: DC | PRN

## 2019-09-17 MED ORDER — ACETAMINOPHEN 325 MG PO TABS: 650.0000 mg | ORAL_TABLET | Freq: Four times a day (QID) | ORAL | Status: DC | PRN

## 2019-09-17 MED ORDER — SODIUM CHLORIDE 0.9 % IV SOLN
500.0000 mg | Freq: Once | INTRAVENOUS | Status: AC
  Administered 2019-09-17: 500 mg via INTRAVENOUS
  Filled 2019-09-17: qty 500

## 2019-09-17 MED ORDER — SODIUM CHLORIDE 0.9% FLUSH: 3.0000 mL | INTRAVENOUS | Status: DC | PRN

## 2019-09-17 MED ORDER — SODIUM CHLORIDE 0.9 % IV SOLN: 250.0000 mL | INTRAVENOUS | Status: DC | PRN

## 2019-09-17 MED ORDER — PREDNISONE 20 MG PO TABS
40.0000 mg | ORAL_TABLET | Freq: Every day | ORAL | Status: DC
  Administered 2019-09-17 – 2019-09-21 (×5): 40 mg via ORAL
  Filled 2019-09-17 (×5): qty 2

## 2019-09-17 MED ORDER — ONDANSETRON HCL 4 MG/2ML IJ SOLN
4.0000 mg | Freq: Four times a day (QID) | INTRAMUSCULAR | Status: DC | PRN
  Administered 2019-09-18: 4 mg via INTRAVENOUS
  Filled 2019-09-17: qty 2

## 2019-09-17 MED ORDER — IPRATROPIUM BROMIDE 0.02 % IN SOLN
0.5000 mg | Freq: Four times a day (QID) | RESPIRATORY_TRACT | Status: DC
  Administered 2019-09-17 – 2019-09-19 (×8): 0.5 mg via RESPIRATORY_TRACT
  Filled 2019-09-17 (×10): qty 2.5

## 2019-09-17 MED ORDER — SODIUM CHLORIDE 0.9% FLUSH
3.0000 mL | Freq: Two times a day (BID) | INTRAVENOUS | Status: DC
  Administered 2019-09-17 – 2019-09-21 (×9): 3 mL via INTRAVENOUS

## 2019-09-17 MED ORDER — ALBUTEROL SULFATE (2.5 MG/3ML) 0.083% IN NEBU
2.5000 mg | INHALATION_SOLUTION | Freq: Four times a day (QID) | RESPIRATORY_TRACT | Status: DC
  Administered 2019-09-17 – 2019-09-18 (×3): 2.5 mg via RESPIRATORY_TRACT
  Filled 2019-09-17 (×4): qty 3

## 2019-09-17 NOTE — ED Triage Notes (Signed)
Pt arrives via [facility] from [facility], c/o acute dyspnea that has progressively worsened over the last 9 hrs. Pt was mildly hypoxic on room air (90%). Expiratory wheezing and mild crackles noted by EMS bilaterally. VS: BP 129/94, HR 120 (ST), RR 22, SPO2 100% 6/L [place]. GCS 15.

## 2019-09-17 NOTE — H&P (Addendum)
History and Physical    Krystal Johns IDP:[id number] DOB: August 11, 1923 DOA: 09/17/2019  PCP: William Hamburger D, NP    Patient coming from: Sister home    Chief Complaint: Shortness of breath, wheezing, dry cough  HPI: Krystal Johns is a 86 y.o. female with medical history significant of hypertension, hypothyroidism, vitamin D deficiency, hyperlipidemia, came with a chief complaint of dry cough for the past few days, shortness of breath and some wheezing.  She is not on oxygen at home.  ED Course:  In the emergency room she was hypoxic saturating 88% Had some wheezing Chest x-ray bilateral infiltrate and pleural effusion suspicion of right lower lobe infiltrate pneumonia Started on Zithromax and Rocephin and Solu-Medrol 125mg   Review of Systems: As per HPI otherwise 10 point review of systems negative.  The exception of shortness of breath, wheezing nonproductive cough  Past Medical History:  Diagnosis Date  . Asymptomatic PVCs   . Diverticulitis of colon   . Hypertension   . Osteoporosis   . PAC (premature atrial contraction)    asymptomatic  . Thyroid disease   . Vitamin D deficiency     Past Surgical History:  Procedure Laterality Date  . STONE EXTRACTION WITH BASKET       reports that she has never smoked. She has never used smokeless tobacco. She reports that she does not drink alcohol or use drugs.  Allergies  Allergen Reactions  . Penicillins Hives    Did it involve swelling of the face/tongue/throat, SOB, or low BP? No Did it involve sudden or severe rash/hives, skin peeling, or any reaction on the inside of your mouth or nose? Yes Did you need to seek medical attention at a hospital or doctor's office? Unknown When did it last happen?per sister If all above answers are "NO", may proceed with cephalosporin use.     Family History  Problem Relation Age of Onset  . Heart disease Mother   . Hypertension Mother   . Heart disease Father   . Cancer Brother       Prior to Admission medications   Medication Sig Start Date End Date Taking? Authorizing Provider  Cholecalciferol (VITAMIN D3) 2000 units TABS Take by mouth daily.   Yes [provider]  levothyroxine (SYNTHROID) 50 MCG tablet Take 1 tablet (50 mcg total) by mouth daily. 08/30/19  Yes [place], 09/01/19 D, NP  lisinopril (ZESTRIL) 10 MG tablet Take 1 tablet (10 mg total) by mouth daily. 09/04/19  Yes 09/06/19 D, NP    Physical Exam: Vitals:   09/17/19 0415 09/17/19 0430 09/17/19 0445 09/17/19 0500  BP: (!) 101/58 104/67 107/60 (!) 108/55  Pulse: 91 91 96 90  Resp: (!) 27 (!) 24 19 (!) 22  Temp:      TempSrc:      SpO2: 93% 93% 94% 93%  Weight:      Height:        Constitutional: NAD, calm, comfortable Vitals:   09/17/19 0415 09/17/19 0430 09/17/19 0445 09/17/19 0500  BP: (!) 101/58 104/67 107/60 (!) 108/55  Pulse: 91 91 96 90  Resp: (!) 27 (!) 24 19 (!) 22  Temp:      TempSrc:      SpO2: 93% 93% 94% 93%  Weight:      Height:       Eyes: PERRL, lids and conjunctivae normal ENMT: Mucous membranes are moist. Posterior pharynx clear of any exudate or lesions.Normal dentition.  Neck: normal, supple, no masses, no thyromegaly  Respiratory: , ++ wheezing, ++ crackles. Normal respiratory effort. No accessory muscle use.  Cardiovascular: Regular rate and rhythm, no murmurs / rubs / gallops. No extremity edema. 2+ pedal pulses. No carotid bruits.  Abdomen: no tenderness, no masses palpated. No hepatosplenomegaly. Bowel sounds positive.  Musculoskeletal: no clubbing / cyanosis. No joint deformity upper and lower extremities. Good ROM, no contractures. Normal muscle tone.  Skin: no rashes, lesions, ulcers. No induration Neurologic: CN 2-12 grossly intact. Sensation intact, DTR normal. Strength 5/5 in all 4.  Psychiatric: Normal judgment and insight. Alert and oriented x 3. Normal mood.    Labs on Admission: I have personally reviewed following labs and imaging  studies  CBC: Recent Labs  Lab 09/17/19 0219  WBC 9.0  NEUTROABS 7.5  HGB 12.5  HCT 39.4  MCV 96.8  PLT 629   Basic Metabolic Panel: Recent Labs  Lab 09/17/19 0219  NA 139  K 4.3  CL 106  CO2 21*  GLUCOSE 157*  BUN 25*  CREATININE 1.21*  CALCIUM 8.8*   GFR: Estimated Creatinine Clearance: 21.4 mL/min (A) (by C-G formula based on SCr of 1.21 mg/dL (H)). Liver Function Tests: No results for input(s): AST, ALT, ALKPHOS, BILITOT, PROT, ALBUMIN in the last 168 hours. No results for input(s): LIPASE, AMYLASE in the last 168 hours. No results for input(s): AMMONIA in the last 168 hours. Coagulation Profile: No results for input(s): INR, PROTIME in the last 168 hours. Cardiac Enzymes: No results for input(s): CKTOTAL, CKMB, CKMBINDEX, TROPONINI in the last 168 hours. BNP (last 3 results) No results for input(s): PROBNP in the last 8760 hours. HbA1C: No results for input(s): HGBA1C in the last 72 hours. CBG: No results for input(s): GLUCAP in the last 168 hours. Lipid Profile: No results for input(s): CHOL, HDL, LDLCALC, TRIG, CHOLHDL, LDLDIRECT in the last 72 hours. Thyroid Function Tests: No results for input(s): TSH, T4TOTAL, FREET4, T3FREE, THYROIDAB in the last 72 hours. Anemia Panel: No results for input(s): VITAMINB12, FOLATE, FERRITIN, TIBC, IRON, RETICCTPCT in the last 72 hours. Urine analysis:    Component Value Date/Time   BILIRUBINUR negative 07/21/2018 0927   PROTEINUR Negative 07/21/2018 0927   UROBILINOGEN 0.2 07/21/2018 0927   NITRITE negative 07/21/2018 0927   LEUKOCYTESUR Trace (A) 07/21/2018 0927    Radiological Exams on Admission: DG Chest Port 1 View  Result Date: 09/17/2019 CLINICAL DATA:  Hypoxia and shortness of breath. EXAM: PORTABLE CHEST 1 VIEW COMPARISON:  12/06/2014 FINDINGS: Low lung volumes. Bilateral pleural effusions, left greater than right with associated bibasilar opacities. Upper normal heart size, partially obscured by pleural  fluid. Aortic atherosclerosis. Mild vascular congestion possible mild septal thickening/pulmonary edema. No pneumothorax. Bones are under mineralized. IMPRESSION: 1. Bilateral pleural effusions, left greater than right, with associated bibasilar opacities, likely atelectasis, however pneumonia is also is considered particularly at the right lung base. 2. Mild vascular congestion and possible mild pulmonary edema. Aortic Atherosclerosis (ICD10-I70.0). Electronically Signed   By: Keith Rake M.D.   On: 09/17/2019 02:14    EKG: Independently reviewed.  Sinus tachycardia no acute ST-T changes  Assessment plan  Community-acquired pneumonia Patient brought with shortness of breath mildly wheezing  and some crackles Chest x-ray bilateral infiltrates with some pleural effusion suspicion of right lower lobe infiltrate Covid test negative Patient started on Rocephin and Zithromax, Solu-Medrol, 2 L of oxygen  Essential hypertension Resume home medication  Hypothyroid state Resume Synthroid  Assessment/Plan Principal Problem:   CAP (community acquired pneumonia) Active Problems:  Hypertension   Vitamin D deficiency   Hypothyroidism   Elevated LDL cholesterol level      DVT prophylaxis: Lovenox Code Status: Full code Family Communication: sister was called Disposition Plan: Home Consults called: None Admission status: Full admission   Katharyn Schauer G Baby Stairs MD [facility]  If 7PM-7AM, please contact night-coverage [email or web address]   09/17/2019, 5:22 AM

## 2019-09-17 NOTE — Progress Notes (Signed)
PROGRESS NOTE    Krystal Johns  VEH:[id number] DOB: August 14, 1923 DOA: 09/17/2019 PCP: Julaine Fusi, NP   Brief Narrative: 86 year old with past medical history significant for hypertension, hypothyroidism, vitamin D deficiency, hyperlipidemia patient complaining of dry cough for the past few days, worsening shortness of breath and wheezing.  She is not on oxygen at home.  Patient was found to be hypoxic oxygen sat 88, she had a bilateral wheezing, chest x-ray showed bilateral infiltrates and pleural effusion suspicious for right lower lobe infiltrate pneumonia.  She was a started on IV Zithromax and Rocephin and Solu-Medrol.   Assessment & Plan:   Principal Problem:   CAP (community acquired pneumonia) Active Problems:   Hypertension   Vitamin D deficiency   Hypothyroidism   Elevated LDL cholesterol level  1-Acute hypoxic respiratory failure: New oxygen requirement.  Patient currently requiring 3 L of oxygen to keep oxygen sat above 90 Chest x-ray show bilateral infiltrates and some pleural effusion. Covid test negative. Treating for pneumonia. BiPAP as needed for increased work of breathing or hypoxemia. Continue with scheduled nebulizer treatments. Checking 2D echo.  2-Community-acquired pneumonia: Continue with IV ceftriaxone and Rocephin.  She received a dose of IV Solu-Medrol. On prednisone taper.  3-Hypertension: Systolic blood pressure soft hold blood pressure medication.  4-Hypothyroidism: Continue with Synthroid. 5-AKI: Received IV fluids.  We will hold IV fluids to avoid worsening pleural effusion. Creatinine on admission 1.2.  Prior creatinine 0.9 a year ago.   Estimated body mass index is 18.6 kg/m as calculated from the following:   Height as of this encounter: 5\' 3"  (1.6 m).   Weight as of this encounter: 47.6 kg.   DVT prophylaxis: Lovenox Code Status: Full code Family Communication: Family updated today by admitting physician. Disposition Plan:    Patient from home. Awaiting PT eval to help with discharge planning Remain in the hospital for treatment of new oxygen requirement acute hypoxic respiratory failure secondary to pneumonia requiring IV antibiotics.  Consultants:   None  Procedures:   Echo  Antimicrobials:    Subjective: She is alert and conversant.  She reports mild cough and shortness of breath. As a baby she had pneumonia and her mom was told that she was  not going be able to survive.  And " today I am  86 Y/O"  Objective: Vitals:   09/17/19 0500 09/17/19 0530 09/17/19 0545 09/17/19 0630  BP: (!) 108/55 120/65 (!) 102/59 110/62  Pulse: 90 97 92 99  Resp: (!) 22 (!) 25 (!) 25 (!) 21  Temp:      TempSrc:      SpO2: 93% 91% 92% 93%  Weight:      Height:       No intake or output data in the 24 hours ending 09/17/19 0744 Filed Weights   09/17/19 0115  Weight: 47.6 kg    Examination:  General exam: Appears calm and comfortable  Respiratory system: Mild tachypnea, bilateral crackles no wheezing l. Cardiovascular system: S1 & S2 heard, RRR. No JVD, murmurs, rubs, gallops or clicks. No pedal edema. Gastrointestinal system: Abdomen is nondistended, soft and nontender. No organomegaly or masses felt. Normal bowel sounds heard. Central nervous system: Alert and oriented.  Extremities: Symmetric 5 x 5 power. Skin: No rashes, lesions or ulcers    Data Reviewed: I have personally reviewed following labs and imaging studies  CBC: Recent Labs  Lab 09/17/19 0219  WBC 9.0  NEUTROABS 7.5  HGB 12.5  HCT 39.4  MCV 96.8  PLT 255   Basic Metabolic Panel: Recent Labs  Lab 09/17/19 0219  NA 139  K 4.3  CL 106  CO2 21*  GLUCOSE 157*  BUN 25*  CREATININE 1.21*  CALCIUM 8.8*   GFR: Estimated Creatinine Clearance: 21.4 mL/min (A) (by C-G formula based on SCr of 1.21 mg/dL (H)). Liver Function Tests: No results for input(s): AST, ALT, ALKPHOS, BILITOT, PROT, ALBUMIN in the last 168 hours. No  results for input(s): LIPASE, AMYLASE in the last 168 hours. No results for input(s): AMMONIA in the last 168 hours. Coagulation Profile: No results for input(s): INR, PROTIME in the last 168 hours. Cardiac Enzymes: No results for input(s): CKTOTAL, CKMB, CKMBINDEX, TROPONINI in the last 168 hours. BNP (last 3 results) No results for input(s): PROBNP in the last 8760 hours. HbA1C: No results for input(s): HGBA1C in the last 72 hours. CBG: No results for input(s): GLUCAP in the last 168 hours. Lipid Profile: No results for input(s): CHOL, HDL, LDLCALC, TRIG, CHOLHDL, LDLDIRECT in the last 72 hours. Thyroid Function Tests: No results for input(s): TSH, T4TOTAL, FREET4, T3FREE, THYROIDAB in the last 72 hours. Anemia Panel: No results for input(s): VITAMINB12, FOLATE, FERRITIN, TIBC, IRON, RETICCTPCT in the last 72 hours. Sepsis Labs: Recent Labs  Lab 09/17/19 0224  LATICACIDVEN 1.9    Recent Results (from the past 240 hour(s))  Respiratory Panel by RT PCR (Flu A&B, Covid) - Nasopharyngeal Swab     Status: None   Collection Time: 09/17/19  2:44 AM   Specimen: Nasopharyngeal Swab  Result Value Ref Range Status   SARS Coronavirus 2 by RT PCR NEGATIVE NEGATIVE Final    Comment: (NOTE) SARS-CoV-2 target nucleic acids are NOT DETECTED. The SARS-CoV-2 RNA is generally detectable in upper respiratoy specimens during the acute phase of infection. The lowest concentration of SARS-CoV-2 viral copies this assay can detect is 131 copies/mL. A negative result does not preclude SARS-Cov-2 infection and should not be used as the sole basis for treatment or other patient management decisions. A negative result may occur with  improper specimen collection/handling, submission of specimen other than nasopharyngeal swab, presence of viral mutation(s) within the areas targeted by this assay, and inadequate number of viral copies (<131 copies/mL). A negative result must be combined with  clinical observations, patient history, and epidemiological information. The expected result is Negative. Fact Sheet for Patients:  https://www.moore.com/ Fact Sheet for Healthcare Providers:  https://www.young.biz/ This test is not yet ap proved or cleared by the [place] FDA and  has been authorized for detection and/or diagnosis of SARS-CoV-2 by FDA under an Emergency Use Authorization (EUA). This EUA will remain  in effect (meaning this test can be used) for the duration of the COVID-19 declaration under Section 564(b)(1) of the Act, 21 U.S.C. section 360bbb-3(b)(1), unless the authorization is terminated or revoked sooner.    Influenza A by PCR NEGATIVE NEGATIVE Final   Influenza B by PCR NEGATIVE NEGATIVE Final    Comment: (NOTE) The Xpert Xpress SARS-CoV-2/FLU/RSV assay is intended as an aid in  the diagnosis of influenza from Nasopharyngeal swab specimens and  should not be used as a sole basis for treatment. Nasal washings and  aspirates are unacceptable for Xpert Xpress SARS-CoV-2/FLU/RSV  testing. Fact Sheet for Patients: https://www.moore.com/ Fact Sheet for Healthcare Providers: https://www.young.biz/ This test is not yet approved or cleared by the [place] FDA and  has been authorized for detection and/or diagnosis of SARS-CoV-2 by  FDA under an Emergency Use Authorization (EUA). This  EUA will remain  in effect (meaning this test can be used) for the duration of the  Covid-19 declaration under Section 564(b)(1) of the Act, 21  U.S.C. section 360bbb-3(b)(1), unless the authorization is  terminated or revoked. Performed at [facility], [place] [address] [place], [place] [address]          Radiology Studies: DG Chest Port 1 View  Result Date: 09/17/2019 CLINICAL DATA:  Hypoxia and shortness of breath. EXAM: PORTABLE CHEST 1 VIEW COMPARISON:  12/06/2014 FINDINGS: Low lung  volumes. Bilateral pleural effusions, left greater than right with associated bibasilar opacities. Upper normal heart size, partially obscured by pleural fluid. Aortic atherosclerosis. Mild vascular congestion possible mild septal thickening/pulmonary edema. No pneumothorax. Bones are under mineralized. IMPRESSION: 1. Bilateral pleural effusions, left greater than right, with associated bibasilar opacities, likely atelectasis, however pneumonia is also is considered particularly at the right lung base. 2. Mild vascular congestion and possible mild pulmonary edema. Aortic Atherosclerosis (ICD10-I70.0). Electronically Signed   By: Keith Rake M.D.   On: 09/17/2019 02:14        Scheduled Meds: . albuterol  2.5 mg Inhalation Q6H  . cholecalciferol  2,000 Units Oral Daily  . enoxaparin (LOVENOX) injection  30 mg Subcutaneous Daily  . levothyroxine  50 mcg Oral Daily  . lisinopril  10 mg Oral Daily  . predniSONE  40 mg Oral Q breakfast  . sodium chloride flush  3 mL Intravenous Q12H   Continuous Infusions: . sodium chloride 75 mL/hr at 09/17/19 0641  . sodium chloride    . [START ON 09/18/2019] azithromycin    . [START ON 09/18/2019] cefTRIAXone (ROCEPHIN)  IV       LOS: 0 days    Time spent: 35 miutes    Cherokee Boccio A Haidan Nhan, MD [facility]  If 7PM-7AM, please contact night-coverage  09/17/2019, 7:44 AM

## 2019-09-17 NOTE — ED Provider Notes (Signed)
[facility] EMERGENCY DEPARTMENT Provider Note   CSN: [id number] Arrival date & time: 09/17/19  0103     History Chief Complaint  Patient presents with  . Shortness of Breath    Krystal Johns is a 86 y.o. female.  The history is provided by the patient and medical records.  Shortness of Breath   86 year old female with history of hypertension, thyroid disease, vitamin D deficiency, hyperlipidemia, presenting to the ED with shortness of breath.  States she has had a nonproductive cough for the past few days, became very short of breath over the past several hours.  Symptoms are worse when trying to lay down, better when sitting upright.  She denies any chest pain.  No noted fevers, sick contacts, or known covid exposures.  She does not generally require home O2, currently on 2L.    Past Medical History:  Diagnosis Date  . Asymptomatic PVCs   . Diverticulitis of colon   . Hypertension   . Osteoporosis   . PAC (premature atrial contraction)    asymptomatic  . Thyroid disease   . Vitamin D deficiency     Patient Active Problem List   Diagnosis Date Noted  . Encounter for Medicare annual wellness exam 12/07/2018  . Altered mental status 07/21/2018  . Abnormal urinalysis 07/21/2018  . Elevated LDL cholesterol level 05/26/2018  . Healthcare maintenance 05/26/2017  . Olecranon fracture, right, closed, initial encounter 11/09/2016  . Systolic murmur 11/22/2014  . Hypothyroidism 11/22/2014  . Hypertension 02/14/2011  . Osteoporosis 02/14/2011  . Vitamin D deficiency 02/14/2011    Past Surgical History:  Procedure Laterality Date  . STONE EXTRACTION WITH BASKET       OB History   No obstetric history on file.     Family History  Problem Relation Age of Onset  . Heart disease Mother   . Hypertension Mother   . Heart disease Father   . Cancer Brother     Social History   Tobacco Use  . Smoking status: Never Smoker  . Smokeless tobacco: Never  Used  Substance Use Topics  . Alcohol use: No  . Drug use: No    Home Medications Prior to Admission medications   Medication Sig Start Date End Date Taking? Authorizing Provider  Cholecalciferol (VITAMIN D3) 2000 units TABS Take by mouth daily.    [provider]  levothyroxine (SYNTHROID) 50 MCG tablet Take 1 tablet (50 mcg total) by mouth daily. 08/30/19   Danford, Orpha Bur D, NP  lisinopril (ZESTRIL) 10 MG tablet Take 1 tablet (10 mg total) by mouth daily. 09/04/19   William Hamburger D, NP    Allergies    Penicillins  Review of Systems   Review of Systems  Respiratory: Positive for shortness of breath.   All other systems reviewed and are negative.   Physical Exam Updated Vital Signs BP 132/88   Pulse (!) 114   Temp 97.7 F (36.5 C) (Oral)   Resp (!) 21   Ht 5\' 3"  (1.6 m)   Wt 47.6 kg   SpO2 94%   BMI 18.60 kg/m   Physical Exam Vitals and nursing note reviewed.  Constitutional:      Appearance: She is well-developed.     Comments: elderly  HENT:     Head: Normocephalic and atraumatic.  Eyes:     Conjunctiva/sclera: Conjunctivae normal.     Pupils: Pupils are equal, round, and reactive to light.  Cardiovascular:     Rate and Rhythm:  Normal rate and regular rhythm.     Heart sounds: Normal heart sounds.  Pulmonary:     Effort: Pulmonary effort is normal.     Breath sounds: Wheezing present. No decreased breath sounds or rhonchi.     Comments: Expiratory wheezes noted, breathing mildly labored, able to speak in shortened sentences Abdominal:     General: Bowel sounds are normal.     Palpations: Abdomen is soft.  Musculoskeletal:        General: Normal range of motion.     Cervical back: Normal range of motion.     Comments: No significant peripheral edema  Skin:    General: Skin is warm and dry.  Neurological:     Mental Status: She is alert and oriented to person, place, and time.     ED Results / Procedures / Treatments   Labs (all labs ordered  are listed, but only abnormal results are displayed) Labs Reviewed  BASIC METABOLIC PANEL - Abnormal; Notable for the following components:      Result Value   CO2 21 (*)    Glucose, Bld 157 (*)    BUN 25 (*)    Creatinine, Ser 1.21 (*)    Calcium 8.8 (*)    GFR calc non Af Amer 38 (*)    GFR calc Af Amer 44 (*)    All other components within normal limits  TROPONIN I (HIGH SENSITIVITY) - Abnormal; Notable for the following components:   Troponin I (High Sensitivity) 61 (*)    All other components within normal limits  RESPIRATORY PANEL BY RT PCR (FLU A&B, COVID)  CBC WITH DIFFERENTIAL/PLATELET  BRAIN NATRIURETIC PEPTIDE  LACTIC ACID, PLASMA    EKG None  Radiology DG Chest Port 1 View  Result Date: 09/17/2019 CLINICAL DATA:  Hypoxia and shortness of breath. EXAM: PORTABLE CHEST 1 VIEW COMPARISON:  12/06/2014 FINDINGS: Low lung volumes. Bilateral pleural effusions, left greater than right with associated bibasilar opacities. Upper normal heart size, partially obscured by pleural fluid. Aortic atherosclerosis. Mild vascular congestion possible mild septal thickening/pulmonary edema. No pneumothorax. Bones are under mineralized. IMPRESSION: 1. Bilateral pleural effusions, left greater than right, with associated bibasilar opacities, likely atelectasis, however pneumonia is also is considered particularly at the right lung base. 2. Mild vascular congestion and possible mild pulmonary edema. Aortic Atherosclerosis (ICD10-I70.0). Electronically Signed   By: Narda Rutherford M.D.   On: 09/17/2019 02:14    Procedures Procedures (including critical care time)  Medications Ordered in ED Medications  azithromycin (ZITHROMAX) 500 mg in sodium chloride 0.9 % 250 mL IVPB (has no administration in time range)  cefTRIAXone (ROCEPHIN) 1 g in sodium chloride 0.9 % 100 mL IVPB (has no administration in time range)  cefTRIAXone (ROCEPHIN) 1 g in sodium chloride 0.9 % 100 mL IVPB (has no  administration in time range)  azithromycin (ZITHROMAX) 500 mg in sodium chloride 0.9 % 250 mL IVPB (has no administration in time range)  methylPREDNISolone sodium succinate (SOLU-MEDROL) 125 mg/2 mL injection 125 mg (125 mg Intravenous Given 09/17/19 0254)  albuterol (VENTOLIN HFA) 108 (90 Base) MCG/ACT inhaler 8 puff (8 puffs Inhalation Given 09/17/19 0250)    ED Course  I have reviewed the triage vital signs and the nursing notes.  Pertinent labs & imaging results that were available during my care of the patient were reviewed by me and considered in my medical decision making (see chart for details).    MDM Rules/Calculators/A&P  86 year old female here with shortness of  breath.  Has had a cough for a few days now, worse over the past several hours.  She has been hypoxic on room air to around 89 to 90%.  Generally does not require home O2.  Denies any recent sick contacts or known Covid exposures.  On exam she does appear somewhat labored and has diffuse expiratory wheezes.  She is able to speak in short sentences.  Plan for screening labs, chest x-ray, Covid screen.  She will be given puffs of albuterol inhaler as well as Solu-Medrol.  Labs overall reassuring.  Troponin is mildly elevated at 61, however not having any current chest pain.  EKG is nonischemic.  Chest x-ray with bilateral pleural effusions, left greater than right along with opacities.  This may represent pneumonia.  Covid screen is negative.  She is still requiring supplemental oxygen, saturating around 93 to 94% on 2 L.  She does appear less labored at this time.  Will start on IV antibiotics and admit for ongoing care.  Discussed with Dr. Criss Alvine-- will admit for ongoing care.  Final Clinical Impression(s) / ED Diagnoses Final diagnoses:  Community acquired pneumonia of right lower lobe of lung  Hypoxia    Rx / DC Orders ED Discharge Orders    None       Larene Pickett, PA-C 09/17/19 2751    Randal Buba, April,  MD 09/17/19 [place], April, MD 09/17/19 7001

## 2019-09-17 NOTE — ED Notes (Signed)
Report attempted, 2W unable to take at this time 

## 2019-09-17 NOTE — Progress Notes (Signed)
Echocardiogram 2D Echocardiogram has been performed.  Krystal Johns 09/17/2019, 1:56 PM

## 2019-09-17 NOTE — ED Notes (Signed)
DINNER ORDERED AT 4:52

## 2019-09-17 NOTE — ED Notes (Signed)
Pt's sister Marvis Moeller) came into the waiting room and provided phone numbers to use for pt updates. Primary number (home phone) is [phone]. Secondary number (cell of sister's husband, Virgil/Vergil) is [phone].

## 2019-09-18 DIAGNOSIS — I5041 Acute combined systolic (congestive) and diastolic (congestive) heart failure: Secondary | ICD-10-CM

## 2019-09-18 DIAGNOSIS — I959 Hypotension, unspecified: Secondary | ICD-10-CM

## 2019-09-18 LAB — MAGNESIUM: Magnesium: 2.2 mg/dL (ref 1.7–2.4)

## 2019-09-18 LAB — BASIC METABOLIC PANEL
Anion gap: 10 (ref 5–15)
BUN: 47 mg/dL — ABNORMAL HIGH (ref 8–23)
CO2: 22 mmol/L (ref 22–32)
Calcium: 8.7 mg/dL — ABNORMAL LOW (ref 8.9–10.3)
Chloride: 108 mmol/L (ref 98–111)
Creatinine, Ser: 1.64 mg/dL — ABNORMAL HIGH (ref 0.44–1.00)
GFR calc Af Amer: 31 mL/min — ABNORMAL LOW (ref >60)
GFR calc non Af Amer: 26 mL/min — ABNORMAL LOW (ref >60)
Glucose, Bld: 124 mg/dL — ABNORMAL HIGH (ref 70–99)
Potassium: 4.9 mmol/L (ref 3.5–5.1)
Sodium: 140 mmol/L (ref 135–145)

## 2019-09-18 LAB — CBC WITH DIFFERENTIAL/PLATELET
Abs Immature Granulocytes: 0.05 10*3/uL (ref 0.00–0.07)
Basophils Absolute: 0 10*3/uL (ref 0.0–0.1)
Basophils Relative: 0 %
Eosinophils Absolute: 0 10*3/uL (ref 0.0–0.5)
Eosinophils Relative: 0 %
HCT: 33.9 % — ABNORMAL LOW (ref 36.0–46.0)
Hemoglobin: 10.9 g/dL — ABNORMAL LOW (ref 12.0–15.0)
Immature Granulocytes: 1 %
Lymphocytes Relative: 7 %
Lymphs Abs: 0.7 10*3/uL (ref 0.7–4.0)
MCH: 30.9 pg (ref 26.0–34.0)
MCHC: 32.2 g/dL (ref 30.0–36.0)
MCV: 96 fL (ref 80.0–100.0)
Monocytes Absolute: 0.8 10*3/uL (ref 0.1–1.0)
Monocytes Relative: 9 %
Neutro Abs: 7.8 10*3/uL — ABNORMAL HIGH (ref 1.7–7.7)
Neutrophils Relative %: 83 %
Platelets: 219 10*3/uL (ref 150–400)
RBC: 3.53 MIL/uL — ABNORMAL LOW (ref 3.87–5.11)
RDW: 13.5 % (ref 11.5–15.5)
WBC: 9.3 10*3/uL (ref 4.0–10.5)
nRBC: 0 % (ref 0.0–0.2)

## 2019-09-18 LAB — BRAIN NATRIURETIC PEPTIDE: B Natriuretic Peptide: 2214.6 pg/mL — ABNORMAL HIGH (ref 0.0–100.0)

## 2019-09-18 MED ORDER — PSYLLIUM 95 % PO PACK
1.0000 | PACK | Freq: Every day | ORAL | Status: DC
  Administered 2019-09-18 – 2019-09-21 (×4): 1 via ORAL
  Filled 2019-09-18 (×4): qty 1

## 2019-09-18 MED ORDER — FUROSEMIDE 10 MG/ML IJ SOLN
20.0000 mg | Freq: Once | INTRAMUSCULAR | Status: AC
  Administered 2019-09-18: 20 mg via INTRAVENOUS

## 2019-09-18 MED ORDER — LORAZEPAM 2 MG/ML IJ SOLN
0.5000 mg | Freq: Four times a day (QID) | INTRAMUSCULAR | Status: AC
  Administered 2019-09-18: 0.5 mg via INTRAVENOUS
  Filled 2019-09-18: qty 1

## 2019-09-18 MED ORDER — FUROSEMIDE 10 MG/ML IJ SOLN
INTRAMUSCULAR | Status: AC
  Filled 2019-09-18: qty 2

## 2019-09-18 MED ORDER — SODIUM CHLORIDE 0.9 % IV SOLN: INTRAVENOUS | Status: DC

## 2019-09-18 MED ORDER — LEVALBUTEROL HCL 0.63 MG/3ML IN NEBU
0.6300 mg | INHALATION_SOLUTION | Freq: Four times a day (QID) | RESPIRATORY_TRACT | Status: DC
  Administered 2019-09-18 – 2019-09-19 (×5): 0.63 mg via RESPIRATORY_TRACT
  Filled 2019-09-18 (×6): qty 3

## 2019-09-18 NOTE — Progress Notes (Signed)
PROGRESS NOTE    Krystal Johns  JSE:[id number] DOB: Jun 05, 1923 DOA: 09/17/2019 PCP: Julaine Fusi, NP   Brief Narrative: 86 year old with past medical history significant for hypertension, hypothyroidism, vitamin D deficiency, hyperlipidemia patient complaining of dry cough for the past few days, worsening shortness of breath and wheezing.  She is not on oxygen at home.  Patient was found to be hypoxic oxygen sat 88, she had a bilateral wheezing, chest x-ray showed bilateral infiltrates and pleural effusion suspicious for right lower lobe infiltrate pneumonia.  She was a started on IV Zithromax and Rocephin and Solu-Medrol.   Assessment & Plan:  Principal Problem:   CAP (community acquired pneumonia) Active Problems:   Hypertension   Vitamin D deficiency   Hypothyroidism   Elevated LDL cholesterol level  1-Acute hypoxic respiratory failure: Multifactorial secondary to pneumonia and heart failure exacerbation. New oxygen requirement.  Patient currently requiring 3 L of oxygen to keep oxygen sat above 90 Chest x-ray show bilateral infiltrates and some pleural effusion. Covid test negative. Treating for pneumonia.  BiPAP as needed for increased work of breathing or hypoxemia. Continue with scheduled nebulizer treatments. 2 ECHO; ejection fraction 25%, left ventricle global hypokinesis, right atrial pressure 15 Patient continued to be tachypneic, will gave one-time dose of IV Laxis.  Will consult cardiology.  2-Community-acquired pneumonia: Continue with IV ceftriaxone and Rocephin.  She received a dose of IV Solu-Medrol. On prednisone taper.  3-Acute systolic Heart failure, Cardiomyopathy;  No prior echocardiogram available. Will give one-time dose IV Lasix.  Systolic blood pressure soft. Cardiology consulted. Holding ACE due to AKI.  Hypertension: Systolic blood pressure soft hold blood pressure medication.  Hypothyroidism: Continue with Synthroid.  AKI: Received IV  fluids.  We will hold IV fluids to avoid worsening pleural effusion. Creatinine on admission 1.2.  Prior creatinine 0.9 a year ago. Suspect component of cardiorenal syndrome. Monitor renal function on IV Lasix.   Estimated body mass index is 18.6 kg/m as calculated from the following:   Height as of this encounter: 5\' 3"  (1.6 m).   Weight as of this encounter: 47.6 kg.   DVT prophylaxis: Lovenox Code Status: Patient is DNI, no chest compressions no defibrillation no ACLS medication.  Only thing patient would want is  BiPAP for respiratory distress as needed. Family Communication: Discussed with sister, who agree with DNI, no chest compressions no defibrillation no medication okay to BiPAP respiratory distress Disposition Plan:  Patient from home. Awaiting PT eval to help with discharge planning Remain in the hospital for treatment of new oxygen requirement acute hypoxic respiratory failure secondary to pneumonia requiring IV antibiotics.  Patient still requiring oxygen supplementation today with heart failure exacerbation. Getting IV lasix.   Consultants:   None  Procedures:   Echo  Antimicrobials:    Subjective: Patient had  oxygen off, he was feeling hot.  Put her back on oxygen and oxygen improved. She is  Tachypneic.   Objective: Vitals:   09/17/19 0500 09/17/19 0530 09/17/19 0545 09/17/19 0630  BP: (!) 108/55 120/65 (!) 102/59 110/62  Pulse: 90 97 92 99  Resp: (!) 22 (!) 25 (!) 25 (!) 21  Temp:      TempSrc:      SpO2: 93% 91% 92% 93%  Weight:      Height:       No intake or output data in the 24 hours ending 09/17/19 0744 Filed Weights   09/17/19 0115  Weight: 47.6 kg    Examination:  General exam:  No acute distress Respiratory system: Tachypneic, bilateral crackles, wheezing,  Cardiovascular system: S 1, S 2 RRR, positive JVD. Gastrointestinal system: BS present, soft, nt Central nervous system: alert, follow commands.  Extremities: trace  edema Skin: No Rashes     Data Reviewed: I have personally reviewed following labs and imaging studies  CBC: Recent Labs  Lab 09/17/19 0219  WBC 9.0  NEUTROABS 7.5  HGB 12.5  HCT 39.4  MCV 96.8  PLT 255   Basic Metabolic Panel: Recent Labs  Lab 09/17/19 0219  NA 139  K 4.3  CL 106  CO2 21*  GLUCOSE 157*  BUN 25*  CREATININE 1.21*  CALCIUM 8.8*   GFR: Estimated Creatinine Clearance: 21.4 mL/min (A) (by C-G formula based on SCr of 1.21 mg/dL (H)). Liver Function Tests: No results for input(s): AST, ALT, ALKPHOS, BILITOT, PROT, ALBUMIN in the last 168 hours. No results for input(s): LIPASE, AMYLASE in the last 168 hours. No results for input(s): AMMONIA in the last 168 hours. Coagulation Profile: No results for input(s): INR, PROTIME in the last 168 hours. Cardiac Enzymes: No results for input(s): CKTOTAL, CKMB, CKMBINDEX, TROPONINI in the last 168 hours. BNP (last 3 results) No results for input(s): PROBNP in the last 8760 hours. HbA1C: No results for input(s): HGBA1C in the last 72 hours. CBG: No results for input(s): GLUCAP in the last 168 hours. Lipid Profile: No results for input(s): CHOL, HDL, LDLCALC, TRIG, CHOLHDL, LDLDIRECT in the last 72 hours. Thyroid Function Tests: No results for input(s): TSH, T4TOTAL, FREET4, T3FREE, THYROIDAB in the last 72 hours. Anemia Panel: No results for input(s): VITAMINB12, FOLATE, FERRITIN, TIBC, IRON, RETICCTPCT in the last 72 hours. Sepsis Labs: Recent Labs  Lab 09/17/19 0224  LATICACIDVEN 1.9    Recent Results (from the past 240 hour(s))  Respiratory Panel by RT PCR (Flu A&B, Covid) - Nasopharyngeal Swab     Status: None   Collection Time: 09/17/19  2:44 AM   Specimen: Nasopharyngeal Swab  Result Value Ref Range Status   SARS Coronavirus 2 by RT PCR NEGATIVE NEGATIVE Final    Comment: (NOTE) SARS-CoV-2 target nucleic acids are NOT DETECTED. The SARS-CoV-2 RNA is generally detectable in upper  respiratoy specimens during the acute phase of infection. The lowest concentration of SARS-CoV-2 viral copies this assay can detect is 131 copies/mL. A negative result does not preclude SARS-Cov-2 infection and should not be used as the sole basis for treatment or other patient management decisions. A negative result may occur with  improper specimen collection/handling, submission of specimen other than nasopharyngeal swab, presence of viral mutation(s) within the areas targeted by this assay, and inadequate number of viral copies (<131 copies/mL). A negative result must be combined with clinical observations, patient history, and epidemiological information. The expected result is Negative. Fact Sheet for Patients:  https://www.moore.com/ Fact Sheet for Healthcare Providers:  https://www.young.biz/ This test is not yet ap proved or cleared by the [place] FDA and  has been authorized for detection and/or diagnosis of SARS-CoV-2 by FDA under an Emergency Use Authorization (EUA). This EUA will remain  in effect (meaning this test can be used) for the duration of the COVID-19 declaration under Section 564(b)(1) of the Act, 21 U.S.C. section 360bbb-3(b)(1), unless the authorization is terminated or revoked sooner.    Influenza A by PCR NEGATIVE NEGATIVE Final   Influenza B by PCR NEGATIVE NEGATIVE Final    Comment: (NOTE) The Xpert Xpress SARS-CoV-2/FLU/RSV assay is intended as an aid in  the  diagnosis of influenza from Nasopharyngeal swab specimens and  should not be used as a sole basis for treatment. Nasal washings and  aspirates are unacceptable for Xpert Xpress SARS-CoV-2/FLU/RSV  testing. Fact Sheet for Patients: PinkCheek.be Fact Sheet for Healthcare Providers: GravelBags.it This test is not yet approved or cleared by the [place] FDA and  has been authorized for  detection and/or diagnosis of SARS-CoV-2 by  FDA under an Emergency Use Authorization (EUA). This EUA will remain  in effect (meaning this test can be used) for the duration of the  Covid-19 declaration under Section 564(b)(1) of the Act, 21  U.S.C. section 360bbb-3(b)(1), unless the authorization is  terminated or revoked. Performed at [facility], [place] [address] [place], [place] [address]          Radiology Studies: DG Chest Port 1 View  Result Date: 09/17/2019 CLINICAL DATA:  Hypoxia and shortness of breath. EXAM: PORTABLE CHEST 1 VIEW COMPARISON:  12/06/2014 FINDINGS: Low lung volumes. Bilateral pleural effusions, left greater than right with associated bibasilar opacities. Upper normal heart size, partially obscured by pleural fluid. Aortic atherosclerosis. Mild vascular congestion possible mild septal thickening/pulmonary edema. No pneumothorax. Bones are under mineralized. IMPRESSION: 1. Bilateral pleural effusions, left greater than right, with associated bibasilar opacities, likely atelectasis, however pneumonia is also is considered particularly at the right lung base. 2. Mild vascular congestion and possible mild pulmonary edema. Aortic Atherosclerosis (ICD10-I70.0). Electronically Signed   By: Keith Rake M.D.   On: 09/17/2019 02:14        Scheduled Meds:  albuterol  2.5 mg Inhalation Q6H   cholecalciferol  2,000 Units Oral Daily   enoxaparin (LOVENOX) injection  30 mg Subcutaneous Daily   levothyroxine  50 mcg Oral Daily   lisinopril  10 mg Oral Daily   predniSONE  40 mg Oral Q breakfast   sodium chloride flush  3 mL Intravenous Q12H   Continuous Infusions:  sodium chloride 75 mL/hr at 09/17/19 0641   sodium chloride     [START ON 09/18/2019] azithromycin     [START ON 09/18/2019] cefTRIAXone (ROCEPHIN)  IV       LOS: 0 days    Time spent: [place], MD [facility]  If 7PM-7AM, please contact  night-coverage  09/17/2019, 7:44 AM

## 2019-09-18 NOTE — Progress Notes (Signed)
Pt arrived to floor via gurney and ambulated with assist of one person to the bed.Oriented to ward and room and attached to [facility] monitor, bed alarm set, will cont to monitor.

## 2019-09-18 NOTE — Consult Note (Signed)
Cardiology Consultation:   Patient ID: Krystal Johns MRN: [id number]; DOB: November 20, 1922  Admit date: 09/17/2019 Date of Consult: 09/18/2019  Primary Care Provider: Esaw Grandchild, NP Primary Cardiologist: No primary care provider on file. New Primary Electrophysiologist:  None    Patient Profile:   Krystal Johns is a 86 y.o. female with a hx of HTN, HLD, hypothyroid, vit D deficiency, who is being seen today for the evaluation of CHF, LVD at the request of Dr Tyrell Antonio.  History of Present Illness:   [name] has no previous cardiac hx.   She was hypoxic in the ER, O2 sats 88%, RLL PNA on CXR. Started on ABX and steroids.  Echo with decreased EF and cards asked.  Ms. Mccowen is oriented to name only.  No family is present, information was obtained from staff and chart notes.  When I first came in the room, her oxygen was off.  Her respiratory rate was increased and she seemed short of breath although she denied it.  O2 sats were low.  Oxygen was reapplied and O2 saturations improved.  Her respiratory rate slowed.  She denies ever having chest pain.  She does not remember having any lower extremity edema.  She does not remember having any fevers or cough.  Earlier, she was complaining that the room was very warm and the temperature was turned down.  Currently, she complains of being chilled.   Heart Pathway Score:      Past Medical History:  Diagnosis Date  . Asymptomatic PVCs   . Diverticulitis of colon   . Hypertension   . Osteoporosis   . PAC (premature atrial contraction)    asymptomatic  . Thyroid disease   . Vitamin D deficiency     Past Surgical History:  Procedure Laterality Date  . STONE EXTRACTION WITH BASKET       Prior to Admission medications   Medication Sig Start Date End Date Taking? Authorizing Provider  Cholecalciferol (VITAMIN D3) 2000 units TABS Take by mouth daily.   Yes [provider]  levothyroxine (SYNTHROID) 50 MCG tablet Take 1  tablet (50 mcg total) by mouth daily. 08/30/19  Yes Danford, Valetta Fuller D, NP  lisinopril (ZESTRIL) 10 MG tablet Take 1 tablet (10 mg total) by mouth daily. 09/04/19  Yes Danford, Berna Spare, NP     Inpatient Medications: Scheduled Meds: . cholecalciferol  2,000 Units Oral Daily  . enoxaparin (LOVENOX) injection  30 mg Subcutaneous Daily  . furosemide      . ipratropium  0.5 mg Nebulization Q6H  . levalbuterol  0.63 mg Nebulization Q6H  . levothyroxine  50 mcg Oral Daily  . predniSONE  40 mg Oral Q breakfast  . sodium chloride flush  3 mL Intravenous Q12H   Continuous Infusions: . sodium chloride    . azithromycin 500 mg (09/18/19 0827)  . cefTRIAXone (ROCEPHIN)  IV 1 g (09/18/19 0745)   PRN Meds: sodium chloride, acetaminophen **OR** acetaminophen, ondansetron **OR** ondansetron (ZOFRAN) IV, sodium chloride flush  Allergies:    Allergies  Allergen Reactions  . Penicillins Hives    Did it involve swelling of the face/tongue/throat, SOB, or low BP? No Did it involve sudden or severe rash/hives, skin peeling, or any reaction on the inside of your mouth or nose? Yes Did you need to seek medical attention at a hospital or doctor's office? Unknown When did it last happen?per sister If all above answers are "NO", may proceed with cephalosporin use.     Social  History:   Social History   Socioeconomic History  . Marital status: Married    Spouse name: Not on file  . Number of children: Not on file  . Years of education: Not on file  . Highest education level: Not on file  Occupational History  . Not on file  Tobacco Use  . Smoking status: Never Smoker  . Smokeless tobacco: Never Used  Substance and Sexual Activity  . Alcohol use: No  . Drug use: No  . Sexual activity: Never  Other Topics Concern  . Not on file  Social History Narrative  . Not on file   Social Determinants of Health   Financial Resource Strain:   . Difficulty of Paying Living Expenses: Not on file    Food Insecurity:   . Worried About Programme researcher, broadcasting/film/video in the Last Year: Not on file  . Ran Out of Food in the Last Year: Not on file  Transportation Needs:   . Lack of Transportation (Medical): Not on file  . Lack of Transportation (Non-Medical): Not on file  Physical Activity:   . Days of Exercise per Week: Not on file  . Minutes of Exercise per Session: Not on file  Stress:   . Feeling of Stress : Not on file  Social Connections:   . Frequency of Communication with Friends and Family: Not on file  . Frequency of Social Gatherings with Friends and Family: Not on file  . Attends Religious Services: Not on file  . Active Member of Clubs or Organizations: Not on file  . Attends Banker Meetings: Not on file  . Marital Status: Not on file  Intimate Partner Violence:   . Fear of Current or Ex-Partner: Not on file  . Emotionally Abused: Not on file  . Physically Abused: Not on file  . Sexually Abused: Not on file     Family Status: Family Status  Relation Name Status  . Mother  Deceased  . Father  Deceased  . Sister  Alive  . Brother  Deceased  . Sister  Alive  . Sister  Alive  . Sister  Alive  . Sister  Alive  . Sister  Alive    Family History:   Family History  Problem Relation Age of Onset  . Heart disease Mother   . Hypertension Mother   . Heart disease Father   . Cancer Brother      ROS:  Please see the history of present illness.  All other ROS reviewed and negative.     Physical Exam/Data:   Vitals:   09/17/19 2205 09/17/19 2209 09/18/19 0000 09/18/19 0059  BP: 122/85 122/85 106/67   Pulse:  (!) 119 (!) 110 (!) 101  Resp: (!) 28 (!) 27 (!) 35 20  Temp:  98.7 F (37.1 C)    TempSrc:  Oral    SpO2: 97% 96% 96% 97%  Weight: 52.5 kg     Height: 5\' 3"  (1.6 m)       Intake/Output Summary (Last 24 hours) at 09/18/2019 1054 Last data filed at 09/17/2019 2214 Gross per 24 hour  Intake 3 ml  Output --  Net 3 ml   Last 3 Weights 09/17/2019  09/17/2019 08/30/2019  Weight (lbs) 115 lb 11.9 oz 105 lb 107 lb 8 oz  Weight (kg) 52.5 kg 47.628 kg 48.762 kg     Body mass index is 20.5 kg/m.  General:  Well nourished, well developed, elderly female in no  acute distress once oxygen reapplied HEENT: normal for age Lymph: no adenopathy Neck: JVD 9-10 cm Endocrine:  No thryomegaly Vascular: No carotid bruits; 4/4 extremity pulses 2+ bilaterally Cardiac:  normal S1, S2; RRR; possible murmur but not clearly heard due to respiratory noise, possible S3 Lungs: Rales bilateral bases, slight wheeze to auscultation bilaterally, no rhonchi  Abd: soft, nontender, no hepatomegaly  Ext: no edema Musculoskeletal:  No deformities, BUE and BLE strength weak but equal Skin: warm and dry  Neuro:  CNs 2-12 intact, no focal abnormalities noted Psych:  Normal affect   EKG:  The EKG was personally reviewed and demonstrates:  01/31 ECG is ST, HR 119, LBBB noted, no old available for comparison Telemetry:  Telemetry was personally reviewed and demonstrates: Sinus tachycardia up into the 120s, sinus bradycardia into the 50s  Relevant CV Studies:  ECHO: 09/17/2019 1. Left ventricular ejection fraction, by visual estimation, is 25 to  30%. The left ventricle has severely decreased function. There is no left  ventricular hypertrophy.  2. Left ventricular diastolic parameters are indeterminate.  3. Mild to moderately dilated left ventricular internal cavity size.  4. The left ventricle demonstrates global hypokinesis.  5. Global right ventricle has normal systolic function.The right  ventricular size is normal. No increase in right ventricular wall  thickness.  6. Left atrial size was severely dilated.  7. Right atrial size was normal.  8. The pericardial effusion is circumferential.  9. Trivial pericardial effusion is present.  10. Mild mitral annular calcification.  11. The mitral valve is normal in structure. Mild to moderate mitral valve    regurgitation. No evidence of mitral stenosis.  12. The tricuspid valve is normal in structure.  13. The tricuspid valve is normal in structure. Tricuspid valve  regurgitation is trivial.  14. The aortic valve is tricuspid. Aortic valve regurgitation is mild. No  evidence of aortic valve sclerosis or stenosis.  15. The pulmonic valve was not well visualized. Pulmonic valve  regurgitation is not visualized.  16. Moderately elevated pulmonary artery systolic pressure.  17. The inferior vena cava is dilated in size with <50% respiratory  variability, suggesting right atrial pressure of 15 mmHg.  Laboratory Data:  High Sensitivity Troponin:   Recent Labs  Lab 09/17/19 0219  TROPONINIHS 61*     Chemistry Recent Labs  Lab 09/17/19 0219 09/18/19 0515  NA 139 140  K 4.3 4.9  CL 106 108  CO2 21* 22  GLUCOSE 157* 124*  BUN 25* 47*  CREATININE 1.21* 1.64*  CALCIUM 8.8* 8.7*  GFRNONAA 38* 26*  GFRAA 44* 31*  ANIONGAP 12 10    No results for input(s): PROT, ALBUMIN, AST, ALT, ALKPHOS, BILITOT in the last 168 hours. Hematology Recent Labs  Lab 09/17/19 0219 09/18/19 0515  WBC 9.0 9.3  RBC 4.07 3.53*  HGB 12.5 10.9*  HCT 39.4 33.9*  MCV 96.8 96.0  MCH 30.7 30.9  MCHC 31.7 32.2  RDW 13.4 13.5  PLT 255 219   BNP Recent Labs  Lab 09/17/19 0220 09/18/19 0515  BNP 19.4 2,214.6*     Lab Results  Component Value Date   TSH 1.850 05/19/2018   Lab Results  Component Value Date   CHOL 186 05/19/2018   HDL 45 05/19/2018   LDLCALC 117 (H) 05/19/2018   TRIG 119 05/19/2018   CHOLHDL 4.1 05/19/2018   Lab Results  Component Value Date   HGBA1C 5.6 05/19/2018    Radiology/Studies:  [facility] 1 [address]  Result Date: 09/17/2019 CLINICAL DATA:  Hypoxia and shortness of breath. EXAM: PORTABLE CHEST 1 VIEW COMPARISON:  12/06/2014 FINDINGS: Low lung volumes. Bilateral pleural effusions, left greater than right with associated bibasilar opacities. Upper normal heart size,  partially obscured by pleural fluid. Aortic atherosclerosis. Mild vascular congestion possible mild septal thickening/pulmonary edema. No pneumothorax. Bones are under mineralized. IMPRESSION: 1. Bilateral pleural effusions, left greater than right, with associated bibasilar opacities, likely atelectasis, however pneumonia is also is considered particularly at the right lung base. 2. Mild vascular congestion and possible mild pulmonary edema. Aortic Atherosclerosis (ICD10-I70.0). Electronically Signed   By: Narda RutherfordMelanie  Sanford M.D.   On: 09/17/2019 02:14   ECHOCARDIOGRAM COMPLETE  Result Date: 09/17/2019   ECHOCARDIOGRAM REPORT   Patient Name:   Krystal AlbaMARGIE Kant Date of Exam: 09/17/2019 Medical Rec #:  [id number]     Height:       63.0 in Accession #:    [id number]    Weight:       105.0 lb Date of Birth:  07/10/1923      BSA:          1.47 m Patient Age:    89 years      BP:           93/79 mmHg Patient Gender: F             HR:           109 bpm. Exam Location:  Inpatient Procedure: 2D Echo, Color Doppler and Cardiac Doppler Indications:    R06.9 DOE  History:        Patient has no prior history of Echocardiogram examinations.                 Signs/Symptoms:Dyspnea; Risk Factors:Hypertension and                 Dyslipidemia.  Sonographer:    Irving BurtonEmily Senior RDCS Referring Phys: [id number] MIRCEA G CRISTESCU IMPRESSIONS  1. Left ventricular ejection fraction, by visual estimation, is 25 to 30%. The left ventricle has severely decreased function. There is no left ventricular hypertrophy.  2. Left ventricular diastolic parameters are indeterminate.  3. Mild to moderately dilated left ventricular internal cavity size.  4. The left ventricle demonstrates global hypokinesis.  5. Global right ventricle has normal systolic function.The right ventricular size is normal. No increase in right ventricular wall thickness.  6. Left atrial size was severely dilated.  7. Right atrial size was normal.  8. The pericardial effusion is  circumferential.  9. Trivial pericardial effusion is present. 10. Mild mitral annular calcification. 11. The mitral valve is normal in structure. Mild to moderate mitral valve regurgitation. No evidence of mitral stenosis. 12. The tricuspid valve is normal in structure. 13. The tricuspid valve is normal in structure. Tricuspid valve regurgitation is trivial. 14. The aortic valve is tricuspid. Aortic valve regurgitation is mild. No evidence of aortic valve sclerosis or stenosis. 15. The pulmonic valve was not well visualized. Pulmonic valve regurgitation is not visualized. 16. Moderately elevated pulmonary artery systolic pressure. 17. The inferior vena cava is dilated in size with <50% respiratory variability, suggesting right atrial pressure of 15 mmHg. FINDINGS  Left Ventricle: Left ventricular ejection fraction, by visual estimation, is 25 to 30%. The left ventricle has severely decreased function. The left ventricle demonstrates global hypokinesis. The left ventricular internal cavity size was mildly to moderately dilated left ventricle. There is no left ventricular hypertrophy. Left ventricular diastolic parameters are indeterminate. Right Ventricle: The  right ventricular size is normal. No increase in right ventricular wall thickness. Global RV systolic function is has normal systolic function. The tricuspid regurgitant velocity is 2.62 m/s, and with an assumed right atrial pressure  of 15 mmHg, the estimated right ventricular systolic pressure is moderately elevated at 42.5 mmHg. Left Atrium: Left atrial size was severely dilated. Right Atrium: Right atrial size was normal in size Pericardium: Trivial pericardial effusion is present. The pericardial effusion is circumferential. Left pleural effusion. Mitral Valve: The mitral valve is normal in structure. There is mild thickening of the mitral valve leaflet(s). Mild mitral annular calcification. Mild to moderate mitral valve regurgitation. No evidence of  mitral valve stenosis by observation. MV peak gradient, 12.2 mmHg. Tricuspid Valve: The tricuspid valve is normal in structure. Tricuspid valve regurgitation is trivial. Aortic Valve: The aortic valve is tricuspid. . There is mild thickening and mild calcification of the aortic valve. Aortic valve regurgitation is mild. The aortic valve is structurally normal, with no evidence of sclerosis or stenosis. Mild aortic valve annular calcification. There is mild thickening of the aortic valve. There is mild calcification of the aortic valve. Aortic valve mean gradient measures 8.7 mmHg. Aortic valve peak gradient measures 14.7 mmHg. Aortic valve area, by VTI measures 1.22 cm. Pulmonic Valve: The pulmonic valve was not well visualized. Pulmonic valve regurgitation is not visualized. Pulmonic regurgitation is not visualized. No evidence of pulmonic stenosis. Aorta: The aortic root is normal in size and structure. Pulmonary Artery: Indeterminant PASP, inadequate TR jet. Venous: The inferior vena cava is dilated in size with less than 50% respiratory variability, suggesting right atrial pressure of 15 mmHg. IAS/Shunts: No atrial level shunt detected by color flow Doppler.  LEFT VENTRICLE PLAX 2D LVIDd:         6.40 cm       Diastology LVIDs:         5.40 cm       LV e' lateral:   10.30 cm/s LV PW:         0.60 cm       LV E/e' lateral: 15.0 LV IVS:        0.60 cm       LV e' medial:    9.36 cm/s LVOT diam:     2.00 cm       LV E/e' medial:  16.5 LV SV:         67 ml LV SV Index:   46.39 LVOT Area:     3.14 cm  LV Volumes (MOD) LV area d, A2C:    50.10 cm LV area d, A4C:    43.30 cm LV area s, A2C:    41.20 cm LV area s, A4C:    35.80 cm LV major d, A2C:   9.51 cm LV major d, A4C:   8.73 cm LV major s, A2C:   8.70 cm LV major s, A4C:   8.01 cm LV vol d, MOD A2C: 218.0 ml LV vol d, MOD A4C: 174.0 ml LV vol s, MOD A2C: 162.0 ml LV vol s, MOD A4C: 132.0 ml LV SV MOD A2C:     56.0 ml LV SV MOD A4C:     174.0 ml LV SV MOD BP:       51.4 ml RIGHT VENTRICLE RV S prime:     10.80 cm/s TAPSE (M-mode): 1.8 cm LEFT ATRIUM             Index       RIGHT  ATRIUM           Index LA diam:        3.60 cm 2.45 cm/m  RA Area:     13.80 cm LA Vol (A2C):   71.0 ml 48.29 ml/m RA Volume:   36.70 ml  24.96 ml/m LA Vol (A4C):   61.1 ml 41.55 ml/m LA Biplane Vol: 67.1 ml 45.63 ml/m  AORTIC VALVE AV Area (Vmax):    1.16 cm AV Area (Vmean):   1.12 cm AV Area (VTI):     1.22 cm AV Vmax:           192.00 cm/s AV Vmean:          136.000 cm/s AV VTI:            0.328 m AV Peak Grad:      14.7 mmHg AV Mean Grad:      8.7 mmHg LVOT Vmax:         71.10 cm/s LVOT Vmean:        48.700 cm/s LVOT VTI:          0.127 m LVOT/AV VTI ratio: 0.39  AORTA Ao Root diam: 3.00 cm Ao Asc diam:  2.80 cm MITRAL VALVE                        TRICUSPID VALVE MV Area (PHT): 5.84 cm             TR Peak grad:   27.5 mmHg MV Peak grad:  12.2 mmHg            TR Vmax:        262.00 cm/s MV Mean grad:  6.0 mmHg MV Vmax:       1.75 m/s             SHUNTS MV Vmean:      117.0 cm/s           Systemic VTI:  0.13 m MV VTI:        0.22 m               Systemic Diam: 2.00 cm MV PHT:        37.70 msec MV Decel Time: 130 msec MV E velocity: 154.00 cm/s 103 cm/s  Dina Rich MD Electronically signed by Dina Rich MD Signature Date/Time: 09/17/2019/2:14:52 PM    Final       n/a  Assessment and Plan:   1. Acute systolic CHF -No history of CHF and she was not on a diuretic prior to admission. - Got Lasix 20 mg IV x1 this a.m. -Initially had lisinopril ordered, but it was discontinued, likely due to decreased renal function, no doses given -Because of bradycardia at times, do not feel comfortable adding a beta-blocker -She needs diuresis, systolic blood pressure 90s-100s, so cannot be aggressive -Discuss with MD if she might tolerate Lasix 40 mg, or we need to continue 20 mg and increase to twice daily or 3 times daily -Right atrial pressure 15 and BNP significantly elevated  from yesterday, so need to be as aggressive as blood pressure and renal function will allow  2. Cardiomyopathy -No apical wall motion abnormalities mentioned -Currently need to focus on diuresis, start meds for the cardiomyopathy once general medical condition is improved  3. CAP -Antibiotics and steroids per IM -She is afebrile and white count is normal  Otherwise, per IM Principal Problem:   CAP (community acquired pneumonia) Active Problems:  Hypertension   Vitamin D deficiency   Hypothyroidism   Elevated LDL cholesterol level   Pneumonia     For questions or updates, please contact [facility] Please consult [email or web address] for contact info under   Signed, Theodore Demark, PA-C  09/18/2019 10:54 AM

## 2019-09-19 ENCOUNTER — Telehealth: Payer: Self-pay | Admitting: Adult Health

## 2019-09-19 ENCOUNTER — Inpatient Hospital Stay (HOSPITAL_COMMUNITY): Payer: Medicare Other

## 2019-09-19 DIAGNOSIS — N179 Acute kidney failure, unspecified: Secondary | ICD-10-CM

## 2019-09-19 LAB — BASIC METABOLIC PANEL
Anion gap: 10 (ref 5–15)
BUN: 64 mg/dL — ABNORMAL HIGH (ref 8–23)
CO2: 22 mmol/L (ref 22–32)
Calcium: 8.5 mg/dL — ABNORMAL LOW (ref 8.9–10.3)
Chloride: 105 mmol/L (ref 98–111)
Creatinine, Ser: 2.21 mg/dL — ABNORMAL HIGH (ref 0.44–1.00)
GFR calc Af Amer: 21 mL/min — ABNORMAL LOW (ref >60)
GFR calc non Af Amer: 18 mL/min — ABNORMAL LOW (ref >60)
Glucose, Bld: 125 mg/dL — ABNORMAL HIGH (ref 70–99)
Potassium: 5.1 mmol/L (ref 3.5–5.1)
Sodium: 137 mmol/L (ref 135–145)

## 2019-09-19 LAB — URINALYSIS, ROUTINE W REFLEX MICROSCOPIC
Bacteria, UA: NONE SEEN
Bilirubin Urine: NEGATIVE
Glucose, UA: NEGATIVE mg/dL
Hgb urine dipstick: NEGATIVE
Ketones, ur: NEGATIVE mg/dL
Nitrite: NEGATIVE
Protein, ur: 30 mg/dL — AB
Specific Gravity, Urine: 1.024 (ref 1.005–1.030)
pH: 5 (ref 5.0–8.0)

## 2019-09-19 LAB — CBC WITH DIFFERENTIAL/PLATELET
Abs Immature Granulocytes: 0.04 10*3/uL (ref 0.00–0.07)
Basophils Absolute: 0 10*3/uL (ref 0.0–0.1)
Basophils Relative: 0 %
Eosinophils Absolute: 0 10*3/uL (ref 0.0–0.5)
Eosinophils Relative: 0 %
HCT: 33.2 % — ABNORMAL LOW (ref 36.0–46.0)
Hemoglobin: 10.6 g/dL — ABNORMAL LOW (ref 12.0–15.0)
Immature Granulocytes: 1 %
Lymphocytes Relative: 8 %
Lymphs Abs: 0.7 10*3/uL (ref 0.7–4.0)
MCH: 30.8 pg (ref 26.0–34.0)
MCHC: 31.9 g/dL (ref 30.0–36.0)
MCV: 96.5 fL (ref 80.0–100.0)
Monocytes Absolute: 0.6 10*3/uL (ref 0.1–1.0)
Monocytes Relative: 8 %
Neutro Abs: 6.9 10*3/uL (ref 1.7–7.7)
Neutrophils Relative %: 83 %
Platelets: 208 10*3/uL (ref 150–400)
RBC: 3.44 MIL/uL — ABNORMAL LOW (ref 3.87–5.11)
RDW: 13.6 % (ref 11.5–15.5)
WBC: 8.3 10*3/uL (ref 4.0–10.5)
nRBC: 0 % (ref 0.0–0.2)

## 2019-09-19 MED ORDER — HYDROMORPHONE HCL 1 MG/ML IJ SOLN: 0.5000 mg | INTRAMUSCULAR | Status: DC | PRN

## 2019-09-19 MED ORDER — IPRATROPIUM BROMIDE 0.02 % IN SOLN
0.5000 mg | Freq: Three times a day (TID) | RESPIRATORY_TRACT | Status: DC
  Administered 2019-09-20: 0.5 mg via RESPIRATORY_TRACT
  Filled 2019-09-19: qty 2.5

## 2019-09-19 MED ORDER — LEVALBUTEROL HCL 0.63 MG/3ML IN NEBU
0.6300 mg | INHALATION_SOLUTION | Freq: Three times a day (TID) | RESPIRATORY_TRACT | Status: DC
  Administered 2019-09-20: 0.63 mg via RESPIRATORY_TRACT
  Filled 2019-09-19: qty 3

## 2019-09-19 MED ORDER — FUROSEMIDE 10 MG/ML IJ SOLN
40.0000 mg | Freq: Once | INTRAMUSCULAR | Status: AC
  Administered 2019-09-19: 40 mg via INTRAVENOUS
  Filled 2019-09-19: qty 4

## 2019-09-19 NOTE — Progress Notes (Signed)
Progress Note  Patient Name: Krystal Johns Date of Encounter: 09/19/2019  Primary Cardiologist: New  Subjective   Feeling about the same today. Sister again present in the room. They had renal ultrasound given rise in creatinine. Sister does not think she would want the patient on dialysis, though we are not yet there. She reports that Dr. Tyrell Johns had mentioned palliative care, which I think is wise. Her blood pressure is slightly higher today. We discussed that the kidneys and heart go together, and one not working well affects the other.  Inpatient Medications    Scheduled Meds: . cholecalciferol  2,000 Units Oral Daily  . enoxaparin (LOVENOX) injection  30 mg Subcutaneous Daily  . furosemide  40 mg Intravenous Once  . ipratropium  0.5 mg Nebulization Q6H  . levalbuterol  0.63 mg Nebulization Q6H  . levothyroxine  50 mcg Oral Daily  . predniSONE  40 mg Oral Q breakfast  . psyllium  1 packet Oral Daily  . sodium chloride flush  3 mL Intravenous Q12H   Continuous Infusions: . sodium chloride    . azithromycin 500 mg (09/19/19 0916)  . cefTRIAXone (ROCEPHIN)  IV 1 g (09/19/19 0838)   PRN Meds: sodium chloride, acetaminophen **OR** acetaminophen, HYDROmorphone (DILAUDID) injection, ondansetron **OR** ondansetron (ZOFRAN) IV, sodium chloride flush   Vital Signs    Vitals:   09/19/19 0816 09/19/19 0830 09/19/19 1243 09/19/19 1425  BP: 110/71 110/71 111/89 111/89  Pulse: 92 96  (!) 103  Resp: (!) 22 (!) 23  (!) 21  Temp:  98.6 F (37 C)    TempSrc:  Axillary    SpO2: 99%   95%  Weight:      Height:        Intake/Output Summary (Last 24 hours) at 09/19/2019 1433 Last data filed at 09/19/2019 0902 Gross per 24 hour  Intake 353 ml  Output 450 ml  Net -97 ml   Last 3 Weights 09/17/2019 09/17/2019 08/30/2019  Weight (lbs) 115 lb 11.9 oz 105 lb 107 lb 8 oz  Weight (kg) 52.5 kg 47.628 kg 48.762 kg      Telemetry    Sinus rhythm - Personally Reviewed  ECG    No new  since 1/31 - Personally Reviewed  Physical Exam   GEN: Sitting up in bed, comfortable. Elderly, frail appearing woman in NAD.   Neck: JVD at low neck sitting up Cardiac: RRR, no murmurs, rubs, or gallops appreciated over crackles Respiratory: Diffuse crackles and fine wheezing GI: Soft, nontender, non-distended  MS: No edema; slightly warmer on exam today  Neuro:  Nonfocal  Psych: Normal affect   Labs    High Sensitivity Troponin:   Recent Labs  Lab 09/17/19 0219  TROPONINIHS 61*      Chemistry Recent Labs  Lab 09/17/19 0219 09/18/19 0515 09/19/19 0248  NA 139 140 137  K 4.3 4.9 5.1  CL 106 108 105  CO2 21* 22 22  GLUCOSE 157* 124* 125*  BUN 25* 47* 64*  CREATININE 1.21* 1.64* 2.21*  CALCIUM 8.8* 8.7* 8.5*  GFRNONAA 38* 26* 18*  GFRAA 44* 31* 21*  ANIONGAP 12 10 10      Hematology Recent Labs  Lab 09/17/19 0219 09/18/19 0515 09/19/19 0248  WBC 9.0 9.3 8.3  RBC 4.07 3.53* 3.44*  HGB 12.5 10.9* 10.6*  HCT 39.4 33.9* 33.2*  MCV 96.8 96.0 96.5  MCH 30.7 30.9 30.8  MCHC 31.7 32.2 31.9  RDW 13.4 13.5 13.6  PLT 255 219  208    BNP Recent Labs  Lab 09/17/19 0220 09/18/19 0515  BNP 19.4 2,214.6*     DDimer No results for input(s): DDIMER in the last 168 hours.   Radiology    US RENAL  Result Date: 09/19/2019 CLINICAL DATA:  Acute kidney injury EXAM: RENAL / URINARY TRACT ULTRASOUND COMPLETE COMPARISON:  None. FINDINGS: Right Kidney: Renal measurements: 7.4 x 3.7 x 3.9 cm = volume: 54 mL . Echogenicity within normal limits. No mass or hydronephrosis visualized. Left Kidney: Renal measurements: 7.4 x 4.1 x 4.1 cm = volume: 65 mL. Echogenicity within normal limits. No mass or hydronephrosis visualized. Bladder: Thick-walled/decompressed. Other: Left pleural effusion. IMPRESSION: Small bilateral kidneys.  No hydronephrosis. Bladder is thick-walled although decompressed. Electronically Signed   By: Charline Bills M.D.   On: 09/19/2019 11:56    Cardiac  Studies   Echo personally reviewed, as noted  Patient Profile     86 y.o. female with new diagnosis of acute systolic and diastolic heart failure  Assessment & Plan    Acute systolic and diastolic heart failure -complicated by hypotension limiting medical therapy -now with acute kidney injury -she is grossly volume overloaded on exam. I am concerned that the kidney injury may be due to congestion. -she has enough blood pressure today to trial furosemide -if this does not significantly improve her respiratory status and kidney function, we may have limited options -we discussed goals of care yesterday. Sister present today. No new decision made, but she would like to speak to palliative care. She did agree that dialysis if needed would not be in line with the patient's preferences.  We will monitor her response to lasix closely. If BP and renal function allows, we may be able to decongest her. I am still concerned about long term prognosis as there is no room for any goal directed medical therapy.  For questions or updates, please contact [facility] Please consult [email or web address] for contact info under        Signed, Jodelle Red, MD  09/19/2019, 2:33 PM

## 2019-09-19 NOTE — Progress Notes (Signed)
PROGRESS NOTE    Krystal Johns  KGM:[id number] DOB: [birth year] DOA: 09/17/2019 PCP: Julaine Fusi, NP   Brief Narrative: 86 year old with past medical history significant for hypertension, hypothyroidism, vitamin D deficiency, hyperlipidemia patient complaining of dry cough for the past few days, worsening shortness of breath and wheezing.  She is not on oxygen at home.  Patient was found to be hypoxic oxygen sat 88, she had a bilateral wheezing, chest x-ray showed bilateral infiltrates and pleural effusion suspicious for right lower lobe infiltrate pneumonia.  She was a started on IV Zithromax and Rocephin and Solu-Medrol.   Assessment & Plan:  Principal Problem:   CAP (community acquired pneumonia) Active Problems:   Hypertension   Vitamin D deficiency   Hypothyroidism   Elevated LDL cholesterol level  1-Acute hypoxic respiratory failure: Multifactorial secondary to pneumonia and heart failure exacerbation. New oxygen requirement.  Patient currently requiring 4 L of oxygen to keep oxygen sat above 90 Chest x-ray show bilateral infiltrates and some pleural effusion. Covid test negative. Treating for pneumonia.  BiPAP as needed for increased work of breathing or hypoxemia. Continue with scheduled nebulizer treatments. 2 ECHO; ejection fraction 25%, left ventricle global hypokinesis, right atrial pressure 15 Patient continued to be tachypneic, received one-time dose of IV Laxis 09-18-2019 Will defer further lasix doses to cardiology.   2-Community-acquired pneumonia: Continue with IV ceftriaxone and Rocephin.  She received a dose of IV Solu-Medrol. On prednisone taper.  3-Acute systolic Heart failure, Cardiomyopathy;  No prior echocardiogram available. Received  one-time dose IV Lasix.  Systolic blood pressure soft. Cardiology consulted. Appreciate assistance.  Holding ACE due to AKI.  Hypertension: Systolic blood pressure soft hold blood pressure medication.   Hypothyroidism: Continue with Synthroid.  AKI:  -Received IV fluids.  We will hold IV fluids to avoid worsening pleural effusion. -Creatinine on admission 1.2.  Prior creatinine 0.9 a year ago. -Suspect component of cardiorenal syndrome. -Cr up to 2. No evidence of urine retention.  -Renal US negative for hydronephrosis.  -AKI could be related to hypotension, poor oral intake vs cardiorenal syndrome.  -avoid fluids due to HF.   Discussed with patient's sister. Krystal Johns critical situation of multiple organ failure, heart failure, PNA, now AKI. Plan to continue with antibiotics for now. No scalation of care. IV dilaudid for increase work of breathing. Could use BIPAP if helps with decreasing work of breathing. Plan to consult palliative care for further goals of care.    Estimated body mass index is 18.6 kg/m as calculated from the following:   Height as of this encounter: 5\' 3"  (1.6 m).   Weight as of this encounter: 47.6 kg.   DVT prophylaxis: Lovenox.  Code Status: Patient is DNI, no chest compressions no defibrillation no ACLS medication.     Only thing patient would want is  BiPAP for respiratory distress as needed. Family Communication: Discussed with sister, who agree with DNI, no chest compressions no defibrillation no medication okay to BiPAP respiratory distress Disposition Plan:  Patient from:  Home. Remain in the hospital for treatment of new oxygen requirement acute hypoxic respiratory failure secondary to pneumonia requiring IV antibiotics. New heart failure, now with worsening AKI. Palliative care consult for goals of care.    Consultants:   None  Procedures:   Echo  Antimicrobials:    Subjective: Patient is alert, she is just waking up. Denies worsening dyspnea.    Objective: Vitals:   09/17/19 0500 09/17/19 0530 09/17/19 0545 09/17/19 0630  BP: 09/19/19)  108/55 120/65 (!) 102/59 110/62  Pulse: 90 97 92 99  Resp: (!) 22 (!) 25 (!) 25 (!) 21  Temp:       TempSrc:      SpO2: 93% 91% 92% 93%  Weight:      Height:       No intake or output data in the 24 hours ending 09/17/19 0744 Filed Weights   09/17/19 0115  Weight: 47.6 kg    Examination:  General exam: Frail, NAD Respiratory system: Tachypnea, crackles bases.  Cardiovascular system: S 1, S 2 RRR Gastrointestinal system: BS present, soft, nt Central nervous system: Alert, follows command Extremities: Trace edema Skin: No rashes    Data Reviewed: I have personally reviewed following labs and imaging studies  CBC: Recent Labs  Lab 09/17/19 0219  WBC 9.0  NEUTROABS 7.5  HGB 12.5  HCT 39.4  MCV 96.8  PLT 937   Basic Metabolic Panel: Recent Labs  Lab 09/17/19 0219  NA 139  K 4.3  CL 106  CO2 21*  GLUCOSE 157*  BUN 25*  CREATININE 1.21*  CALCIUM 8.8*   GFR: Estimated Creatinine Clearance: 21.4 mL/min (A) (by C-G formula based on SCr of 1.21 mg/dL (H)). Liver Function Tests: No results for input(s): AST, ALT, ALKPHOS, BILITOT, PROT, ALBUMIN in the last 168 hours. No results for input(s): LIPASE, AMYLASE in the last 168 hours. No results for input(s): AMMONIA in the last 168 hours. Coagulation Profile: No results for input(s): INR, PROTIME in the last 168 hours. Cardiac Enzymes: No results for input(s): CKTOTAL, CKMB, CKMBINDEX, TROPONINI in the last 168 hours. BNP (last 3 results) No results for input(s): PROBNP in the last 8760 hours. HbA1C: No results for input(s): HGBA1C in the last 72 hours. CBG: No results for input(s): GLUCAP in the last 168 hours. Lipid Profile: No results for input(s): CHOL, HDL, LDLCALC, TRIG, CHOLHDL, LDLDIRECT in the last 72 hours. Thyroid Function Tests: No results for input(s): TSH, T4TOTAL, FREET4, T3FREE, THYROIDAB in the last 72 hours. Anemia Panel: No results for input(s): VITAMINB12, FOLATE, FERRITIN, TIBC, IRON, RETICCTPCT in the last 72 hours. Sepsis Labs: Recent Labs  Lab 09/17/19 0224  LATICACIDVEN 1.9     Recent Results (from the past 240 hour(s))  Respiratory Panel by RT PCR (Flu A&B, Covid) - Nasopharyngeal Swab     Status: None   Collection Time: 09/17/19  2:44 AM   Specimen: Nasopharyngeal Swab  Result Value Ref Range Status   SARS Coronavirus 2 by RT PCR NEGATIVE NEGATIVE Final    Comment: (NOTE) SARS-CoV-2 target nucleic acids are NOT DETECTED. The SARS-CoV-2 RNA is generally detectable in upper respiratoy specimens during the acute phase of infection. The lowest concentration of SARS-CoV-2 viral copies this assay can detect is 131 copies/mL. A negative result does not preclude SARS-Cov-2 infection and should not be used as the sole basis for treatment or other patient management decisions. A negative result may occur with  improper specimen collection/handling, submission of specimen other than nasopharyngeal swab, presence of viral mutation(s) within the areas targeted by this assay, and inadequate number of viral copies (<131 copies/mL). A negative result must be combined with clinical observations, patient history, and epidemiological information. The expected result is Negative. Fact Sheet for Patients:  PinkCheek.be Fact Sheet for Healthcare Providers:  GravelBags.it This test is not yet ap proved or cleared by the [place] FDA and  has been authorized for detection and/or diagnosis of SARS-CoV-2 by FDA under an Emergency Use Authorization (  EUA). This EUA will remain  in effect (meaning this test can be used) for the duration of the COVID-19 declaration under Section 564(b)(1) of the Act, 21 U.S.C. section 360bbb-3(b)(1), unless the authorization is terminated or revoked sooner.    Influenza A by PCR NEGATIVE NEGATIVE Final   Influenza B by PCR NEGATIVE NEGATIVE Final    Comment: (NOTE) The Xpert Xpress SARS-CoV-2/FLU/RSV assay is intended as an aid in  the diagnosis of influenza from Nasopharyngeal swab  specimens and  should not be used as a sole basis for treatment. Nasal washings and  aspirates are unacceptable for Xpert Xpress SARS-CoV-2/FLU/RSV  testing. Fact Sheet for Patients: https://www.moore.com/ Fact Sheet for Healthcare Providers: https://www.young.biz/ This test is not yet approved or cleared by the [place] FDA and  has been authorized for detection and/or diagnosis of SARS-CoV-2 by  FDA under an Emergency Use Authorization (EUA). This EUA will remain  in effect (meaning this test can be used) for the duration of the  Covid-19 declaration under Section 564(b)(1) of the Act, 21  U.S.C. section 360bbb-3(b)(1), unless the authorization is  terminated or revoked. Performed at [facility], [address] [place], [place] [address]          Radiology Studies: DG Chest Port 1 View  Result Date: 09/17/2019 CLINICAL DATA:  Hypoxia and shortness of breath. EXAM: PORTABLE CHEST 1 VIEW COMPARISON:  12/06/2014 FINDINGS: Low lung volumes. Bilateral pleural effusions, left greater than right with associated bibasilar opacities. Upper normal heart size, partially obscured by pleural fluid. Aortic atherosclerosis. Mild vascular congestion possible mild septal thickening/pulmonary edema. No pneumothorax. Bones are under mineralized. IMPRESSION: 1. Bilateral pleural effusions, left greater than right, with associated bibasilar opacities, likely atelectasis, however pneumonia is also is considered particularly at the right lung base. 2. Mild vascular congestion and possible mild pulmonary edema. Aortic Atherosclerosis (ICD10-I70.0). Electronically Signed   By: Narda Rutherford M.D.   On: 09/17/2019 02:14        Scheduled Meds: . albuterol  2.5 mg Inhalation Q6H  . cholecalciferol  2,000 Units Oral Daily  . enoxaparin (LOVENOX) injection  30 mg Subcutaneous Daily  . levothyroxine  50 mcg Oral Daily  . lisinopril  10 mg Oral Daily  .  predniSONE  40 mg Oral Q breakfast  . sodium chloride flush  3 mL Intravenous Q12H   Continuous Infusions: . sodium chloride 75 mL/hr at 09/17/19 0641  . sodium chloride    . [START ON 09/18/2019] azithromycin    . [START ON 09/18/2019] cefTRIAXone (ROCEPHIN)  IV       LOS: 0 days    Time spent: 35 miutes    Daquawn Seelman A Aiana Nordquist, MD [facility]  If 7PM-7AM, please contact night-coverage  09/17/2019, 7:44 AM

## 2019-09-19 NOTE — Telephone Encounter (Signed)
Ms. [name] called to inform us that pt is in the hospital with pneumonia and heart issues.  Advised Ms. [name] that we were aware of the situation since ED since [place] a note.  Tiajuana Amass, CMA

## 2019-09-19 NOTE — Telephone Encounter (Signed)
Marvis Moeller called on behalf of patient (DPR on file) and is requesting to speak with [name] about some concerns she has for the patient. Please contact when available

## 2019-09-20 DIAGNOSIS — Z7189 Other specified counseling: Secondary | ICD-10-CM

## 2019-09-20 DIAGNOSIS — Z66 Do not resuscitate: Secondary | ICD-10-CM

## 2019-09-20 DIAGNOSIS — Z515 Encounter for palliative care: Secondary | ICD-10-CM

## 2019-09-20 DIAGNOSIS — N179 Acute kidney failure, unspecified: Secondary | ICD-10-CM

## 2019-09-20 LAB — BASIC METABOLIC PANEL
Anion gap: 16 — ABNORMAL HIGH (ref 5–15)
BUN: 71 mg/dL — ABNORMAL HIGH (ref 8–23)
CO2: 17 mmol/L — ABNORMAL LOW (ref 22–32)
Calcium: 8.8 mg/dL — ABNORMAL LOW (ref 8.9–10.3)
Chloride: 106 mmol/L (ref 98–111)
Creatinine, Ser: 2 mg/dL — ABNORMAL HIGH (ref 0.44–1.00)
GFR calc Af Amer: 24 mL/min — ABNORMAL LOW (ref >60)
GFR calc non Af Amer: 21 mL/min — ABNORMAL LOW (ref >60)
Glucose, Bld: 120 mg/dL — ABNORMAL HIGH (ref 70–99)
Potassium: 5.5 mmol/L — ABNORMAL HIGH (ref 3.5–5.1)
Sodium: 139 mmol/L (ref 135–145)

## 2019-09-20 MED ORDER — IPRATROPIUM BROMIDE 0.02 % IN SOLN
0.5000 mg | Freq: Two times a day (BID) | RESPIRATORY_TRACT | Status: DC
  Administered 2019-09-20 – 2019-09-21 (×2): 0.5 mg via RESPIRATORY_TRACT
  Filled 2019-09-20 (×2): qty 2.5

## 2019-09-20 MED ORDER — LEVALBUTEROL HCL 0.63 MG/3ML IN NEBU
0.6300 mg | INHALATION_SOLUTION | Freq: Two times a day (BID) | RESPIRATORY_TRACT | Status: DC
  Administered 2019-09-20 – 2019-09-21 (×2): 0.63 mg via RESPIRATORY_TRACT
  Filled 2019-09-20 (×2): qty 3

## 2019-09-20 NOTE — Evaluation (Signed)
Occupational Therapy Evaluation Patient Details Name: Krystal Johns MRN: [id number] DOB: [birth year] Today's Date: 09/20/2019    History of Present Illness Pt is a pleasant 86 yo female with a history of HTN, HLD, hypothyroidism, and systolic heart failure, who presented to ED 1/31 with c/o SOB and wheezing. Upon admission, the pt was found to be hypoxic (SpO2 88% on RA), and dx with community aquired pneumonia in addition to multiple organ failure.   Clinical Impression   PTA patient independent with ADLs, light IADLs, mobility. Admitted for above and limited by problem list below, including impaired balance, decreased activity tolerance and generalized weakness. She currently completes ADLs with min guard to close supervision, transfers with min guard assist and in room mobility with RW and no AD with min guard assist.  On RA during session with SpO2 maintained 93-94%.  Her sister is present and reports plan to setup 24/7 support at discharge.  Based on performance today, OT will follow acutely and recommend HHOT at discharge to optimize independence and return to PLOF with ADLs, mobility.     Follow Up Recommendations  Home health OT;Supervision/Assistance - 24 hour    Equipment Recommendations  None recommended by OT    Recommendations for Other Services       Precautions / Restrictions Precautions Precautions: Fall Precaution Comments: watch SpO2 Restrictions Weight Bearing Restrictions: No      Mobility Bed Mobility Overal bed mobility: Needs Assistance             General bed mobility comments: OOB in recliner upon entry   Transfers Overall transfer level: Needs assistance Equipment used: None;Rolling walker (2 wheeled) Transfers: Sit to/from Stand Sit to Stand: Min guard         General transfer comment: min guard to steady upon standing     Balance Overall balance assessment: Needs assistance Sitting-balance support: No upper extremity supported;Feet  supported Sitting balance-Leahy Scale: Good Sitting balance - Comments: supervision   Standing balance support: Bilateral upper extremity supported;During functional activity;No upper extremity supported Standing balance-Leahy Scale: Poor Standing balance comment: patient able to stand without UE support during grooming, initally B UE support dynamically but able to ambulate back to chair without UE support                           ADL either performed or assessed with clinical judgement   ADL Overall ADL's : Needs assistance/impaired     Grooming: Min guard;Standing   Upper Body Bathing: Set up;Sitting   Lower Body Bathing: Min guard;Sit to/from stand   Upper Body Dressing : Set up;Sitting   Lower Body Dressing: Min guard;Sit to/from stand Lower Body Dressing Details (indicate cue type and reason): able to figure 4 to reach feet for simulated LB dressing, min guard sit to stand  Toilet Transfer: Min guard;Ambulation Toilet Transfer Details (indicate cue type and reason): simulated to/from recliner          Functional mobility during ADLs: Min guard;Rolling walker(or no AD ) General ADL Comments: pt with generalized weakness and impaired balance      Vision Baseline Vision/History: Wears glasses Wears Glasses: Reading only Vision Assessment?: No apparent visual deficits     Perception     Praxis      Pertinent Vitals/Pain Pain Assessment: No/denies pain     Hand Dominance Right   Extremity/Trunk Assessment Upper Extremity Assessment Upper Extremity Assessment: Generalized weakness   Lower Extremity Assessment Lower Extremity Assessment:  Defer to PT evaluation   Cervical / Trunk Assessment Cervical / Trunk Assessment: Kyphotic   Communication Communication Communication: HOH   Cognition Arousal/Alertness: Awake/alert Behavior During Therapy: WFL for tasks assessed/performed Overall Cognitive Status: Within Functional Limits for tasks  assessed                                 General Comments: appears baseline, some STM deficits   General Comments  sister present and supportive, reports plans for 24/7 support at dc; patient with SpO2 93-94% on RA     Exercises     Shoulder Instructions      Home Living Family/patient expects to be discharged to:: Private residence Living Arrangements: Alone Available Help at Discharge: Family;Available PRN/intermittently;Available 24 hours/day Type of Home: House Home Access: Stairs to enter;Ramped entrance Entrance Stairs-Number of Steps: ramp at front, 2 stairs in garage with rail on R Entrance Stairs-Rails: Right Home Layout: One level     Bathroom Shower/Tub: Chief Strategy Officer: Standard     Home Equipment: Environmental consultant - 4 wheels;Walker - 2 wheels;Bedside commode   Additional Comments: reports planning 24/7 support at dc       Prior Functioning/Environment Level of Independence: Independent        Comments: Per pt and sister, pt was independent PTA with cooking, cleaning, dressing, and bathing (did use bird bath rather than shower). Pt sister helps with driving/errands        OT Problem List: Decreased strength;Decreased activity tolerance;Impaired balance (sitting and/or standing);Decreased knowledge of use of DME or AE;Decreased knowledge of precautions      OT Treatment/Interventions: Self-care/ADL training;Energy conservation;DME and/or AE instruction;Therapeutic activities;Balance training;Patient/family education    OT Goals(Current goals can be found in the care plan section) Acute Rehab OT Goals Patient Stated Goal: return home to her yard work OT Goal Formulation: With patient Time For Goal Achievement: 10/04/19 Potential to Achieve Goals: Good  OT Frequency: Min 2X/week   Barriers to D/C:            Co-evaluation              AM-PAC OT "6 Clicks" Daily Activity     Outcome Measure Help from another person  eating meals?: None Help from another person taking care of personal grooming?: A Little Help from another person toileting, which includes using toliet, bedpan, or urinal?: A Little Help from another person bathing (including washing, rinsing, drying)?: A Little Help from another person to put on and taking off regular upper body clothing?: A Little Help from another person to put on and taking off regular lower body clothing?: A Little 6 Click Score: 19   End of Session Equipment Utilized During Treatment: Rolling walker Nurse Communication: Mobility status  Activity Tolerance: Patient tolerated treatment well Patient left: in chair;with call bell/phone within reach;with chair alarm set;with family/visitor present  OT Visit Diagnosis: Other abnormalities of gait and mobility (R26.89);Muscle weakness (generalized) (M62.81)                Time: 4098-1191 OT Time Calculation (min): 28 min Charges:  OT General Charges $OT Visit: 1 Visit OT Evaluation $OT Eval Moderate Complexity: 1 Mod OT Treatments $Self Care/Home Management : 8-22 mins Barry Brunner, OT Acute Rehabilitation Services Pager [phone] Office [phone]   Chancy Milroy 09/20/2019, 2:54 PM

## 2019-09-20 NOTE — Progress Notes (Signed)
Patent examiner [facility])  Hospital Liaison: RN note    Notified by Transition of Care Manger, Raynald Blend, RN of patient/family request for [facility] services at home after discharge. Chart and patient information under review by [facility] physician. Hospice eligibility pending currently.     Writer spoke with patient's sister, Para March @ [phone] to initiate education related to hospice philosophy, services and team approach to care.           verbalized understanding of information given. Per discussion, plan is for discharge to home by private vehicle.     Please send signed and completed DNR form home with patient/family. Patient will need prescriptions for discharge comfort medications.   DME needs have been discussed, patient currently has the following equipment in the home: walker and 3N1.  Sister denies need for additional DME at this time.      [facility] aware of the above. Please notify [facility] when patient is ready to leave the unit at discharge. (Call [phone] or [phone] after 5pm.) [facility] information and contact numbers given to [facility].   Please call with any hospice related questions.     Thank you for this referral.     Elsie Saas, RN, CCM  [facility] Hospital Liaison (listed on AMION under [facility] [place])  [phone]

## 2019-09-20 NOTE — Progress Notes (Signed)
OT Cancellation Note  Patient Details Name: Krystal Johns MRN: [id number] DOB: [birth year]   Cancelled Treatment:    Reason Eval/Treat Not Completed: Other (comment), pt just starting lunch.  OT will return as able.   Barry Brunner, OT Acute Rehabilitation Services Pager [phone] Office [phone]    Chancy Milroy 09/20/2019, 1:24 PM

## 2019-09-20 NOTE — Progress Notes (Signed)
Progress Note  Patient Name: Krystal Johns Date of Encounter: 09/20/2019  Primary Cardiologist: New  Subjective   Sitting in chair, eating lunch, O2 cannula off. No complaints. Sister says the meeting with palliative care went very well. Planned for home hospice with assistance. We discussed the unpredictable nature of heart failure. Median expected survival on the order of ~12-18 mos for a new diagnosis that requires hospitalization, and as she cannot tolerate any goal directed medical therapy, I believe it is reasonable to expect ~12 mos survival or less. We discussed that this is not linear and can stabilize between exacerbations, but the baseline tends to not fully recover between exacerbations. She agrees that home hospice is what the patient would want, and it is what the patient's husband had before he passed.  Inpatient Medications    Scheduled Meds: . cholecalciferol  2,000 Units Oral Daily  . enoxaparin (LOVENOX) injection  30 mg Subcutaneous Daily  . ipratropium  0.5 mg Nebulization BID  . levalbuterol  0.63 mg Nebulization BID  . levothyroxine  50 mcg Oral Daily  . predniSONE  40 mg Oral Q breakfast  . psyllium  1 packet Oral Daily  . sodium chloride flush  3 mL Intravenous Q12H   Continuous Infusions: . sodium chloride    . cefTRIAXone (ROCEPHIN)  IV 1 g (09/20/19 0824)   PRN Meds: sodium chloride, acetaminophen **OR** acetaminophen, HYDROmorphone (DILAUDID) injection, ondansetron **OR** ondansetron (ZOFRAN) IV, sodium chloride flush   Vital Signs    Vitals:   09/20/19 0845 09/20/19 1200 09/20/19 1307 09/20/19 1313  BP: 117/71 97/77    Pulse:   (!) 115 96  Resp:  20  20  Temp: (!) 97.4 F (36.3 C)     TempSrc: Axillary     SpO2:   96% (!) 89%  Weight:      Height:        Intake/Output Summary (Last 24 hours) at 09/20/2019 1359 Last data filed at 09/19/2019 2138 Gross per 24 hour  Intake 3 ml  Output 400 ml  Net -397 ml   Last 3 Weights 09/17/2019  09/17/2019 08/30/2019  Weight (lbs) 115 lb 11.9 oz 105 lb 107 lb 8 oz  Weight (kg) 52.5 kg 47.628 kg 48.762 kg      Telemetry    Sinus rhythm - Personally Reviewed  ECG    No new since 1/31 - Personally Reviewed  Physical Exam   GEN: frail appearing, comfortable sitting up in chair, in NAD. No nasal cannula, satting low 90s.  HEENT: Normal, moist mucous membranes NECK: No JVD sitting upright CARDIAC: regular rhythm, normal S1 and S2, no rubs or gallops. No murmur. VASCULAR: Radial 2+ bilaterally. RESPIRATORY:  Clear to auscultation without wheezing or rhonchi. Faint bibasilar crackles. ABDOMEN: Soft, non-tender, non-distended MUSCULOSKELETAL:  Ambulates independently SKIN: warm, only trace dependent edema NEUROLOGIC:  No focal neuro deficits noted. PSYCHIATRIC:  Normal affect   Labs    High Sensitivity Troponin:   Recent Labs  Lab 09/17/19 0219  TROPONINIHS 61*      Chemistry Recent Labs  Lab 09/17/19 0219 09/18/19 0515 09/19/19 0248  NA 139 140 137  K 4.3 4.9 5.1  CL 106 108 105  CO2 21* 22 22  GLUCOSE 157* 124* 125*  BUN 25* 47* 64*  CREATININE 1.21* 1.64* 2.21*  CALCIUM 8.8* 8.7* 8.5*  GFRNONAA 38* 26* 18*  GFRAA 44* 31* 21*  ANIONGAP 12 10 10      Hematology Recent Labs  Lab 09/17/19  9476 09/18/19 0515 09/19/19 0248  WBC 9.0 9.3 8.3  RBC 4.07 3.53* 3.44*  HGB 12.5 10.9* 10.6*  HCT 39.4 33.9* 33.2*  MCV 96.8 96.0 96.5  MCH 30.7 30.9 30.8  MCHC 31.7 32.2 31.9  RDW 13.4 13.5 13.6  PLT 255 219 208    BNP Recent Labs  Lab 09/17/19 0220 09/18/19 0515  BNP 19.4 2,214.6*     DDimer No results for input(s): DDIMER in the last 168 hours.   Radiology    US RENAL  Result Date: 09/19/2019 CLINICAL DATA:  Acute kidney injury EXAM: RENAL / URINARY TRACT ULTRASOUND COMPLETE COMPARISON:  None. FINDINGS: Right Kidney: Renal measurements: 7.4 x 3.7 x 3.9 cm = volume: 54 mL . Echogenicity within normal limits. No mass or hydronephrosis visualized.  Left Kidney: Renal measurements: 7.4 x 4.1 x 4.1 cm = volume: 65 mL. Echogenicity within normal limits. No mass or hydronephrosis visualized. Bladder: Thick-walled/decompressed. Other: Left pleural effusion. IMPRESSION: Small bilateral kidneys.  No hydronephrosis. Bladder is thick-walled although decompressed. Electronically Signed   By: Charline Bills M.D.   On: 09/19/2019 11:56    Cardiac Studies   Echo personally reviewed, as noted  Patient Profile     86 y.o. female with new diagnosis of acute systolic and diastolic heart failure  Assessment & Plan    Acute systolic and diastolic heart failure -complicated by hypotension limiting medical therapy -now with acute kidney injury -appreciate palliative care involvement. Agree with home hospice. I would recommend PRN oral furosemide vs. Standing daily 40 mg dose based on what blood pressures and kidneys allow, as this will allow for hopefully more comfortable breathing. Given that the focus is on comfort, I think furosemide would fit with this.  I am always available for additional questions, even after discharge. However, we all agreed that she probably does not need routine outpatient cardiology follow up, as she will have hospice assistance for symptoms. Sister also wishes to avoid unnecessary visits/trips given Covid pandemic, which is completely reasonable.  [facility] will sign off.   Medication Recommendations:  PRN vs standing furosemide daily for symptoms/shortness of breath/swelling Other recommendations (labs, testing, etc):  none Follow up as an outpatient:  No routine (available if any questions).  For questions or updates, please contact [facility] Please consult [email or web address] for contact info under        Signed, Jodelle Red, MD  09/20/2019, 1:59 PM

## 2019-09-20 NOTE — Consult Note (Signed)
Consultation Note Date: 09/20/2019   Patient Name: Krystal Johns  DOB: 1922-12-11  MRN: [id number]  Age / Sex: 86 y.o., female  PCP: Esaw Grandchild, NP Referring Physician: Tomma Rakers*  Reason for Consultation: Establishing goals of care  HPI/Patient Profile: 86 y.o. female  with past medical history of HTN, HLD, and hypothyroidism admitted on 09/17/2019 with shortness of breath. Chest xray revealed bilateral infiltrates and pleural effusion. Being treated with IV antibiotics.  Echo revealed EF of 25% - has received a couple of doses of lasix but BP is soft and also with AKI. Creatinine continues to rise. PMT consulted for [place].   Clinical Assessment and Goals of Care: I have reviewed medical records including EPIC notes, labs and imaging, received report from RN, assessed the patient and then met with patient and her Johns, Krystal Johns,  to discuss diagnosis prognosis, [place], EOL wishes, disposition and options.  Krystal Johns is Krystal Johns - she is 75 years younger and helps with Krystal Johns's decision making. Krystal Johns's spouse passed away 6 years ago.   I introduced Palliative Medicine as specialized medical care for people living with serious illness. It focuses on providing relief from the symptoms and stress of a serious illness. The goal is to improve quality of life for both the patient and the family.  We discussed a brief life review of the patient. 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 She has lived alone since he passed away 6 years ago.   As far as functional and nutritional status, Krystal Johns was able to care for herself independently prior to hospital admission. Johns also feels that she had a good appetite. She does endorse short-term memory loss and tells me Krystal Johns is easily confused.   We discussed her current illness and what it means in the larger context of her on-going co-morbidities.  Natural  disease trajectory and expectations at EOL were discussed. We specifically discussed Krystal Johns's heart failure and kidney failure. We discussed her overall decline with weakness and poor appetite.   I attempted to elicit values and goals of care important to the patient.  Krystal Johns tells me that what would be most important to [name] is being home.   The difference between aggressive medical intervention and comfort care was considered in light of the patient's goals of care. We discussed that continued aggressive medical interventions are limited. Krystal Johns agrees to transitioning focus to symptom management and focusing on comfort.   Advance directives, concepts specific to code status, artifical feeding and hydration, and rehospitalization were considered and discussed. Krystal Johns agrees to DNR - she would not want Krystal Johns moved to ICU or bipap to be used if her condition were to worsen - she would want medications provided to ensure Krystal Johns's comfort and avoid suffering.   Hospice and Palliative Care services outpatient were explained and offered. Krystal Johns is interested in hospice care at home. She also plans to hire 24/7 care at home - I have asked TOC team to help assist [facility] with this.   Questions and concerns were addressed. The family was encouraged to call with questions or concerns.   Primary Decision Maker NEXT OF KIN - Johns Krystal Johns    SUMMARY OF RECOMMENDATIONS    - code status changed to DNR, if Krystal Johns were to worsen transition to full comfort care - no ICU, no bipap - hospice care at home when ready for discharge, Johns working on hiring 24/7 care at home - have asked TOC  to assist  Code Status/Advance Care Planning:  DNR   Symptom Management:   Has low dose dilaudid ordered PRN for shortness of breath  Psycho-social/Spiritual:   Desire for further Chaplaincy support:no  Additional Recommendations: Education on Hospice  Prognosis:   Weeks-months  Discharge  Planning: Home with Hospice      Primary Diagnoses: Present on Admission: . Hypertension . Vitamin D deficiency . Hypothyroidism . Elevated LDL cholesterol level . Pneumonia   I have reviewed the medical record, interviewed the patient and family, and examined the patient. The following aspects are pertinent.  Past Medical History:  Diagnosis Date  . Asymptomatic PVCs   . Diverticulitis of colon   . Hypertension   . Osteoporosis   . PAC (premature atrial contraction)    asymptomatic  . Thyroid disease   . Vitamin D deficiency    Social History   Socioeconomic History  . Marital status: Married    Spouse name: Not on file  . Number of children: Not on file  . Years of education: Not on file  . Highest education level: Not on file  Occupational History  . Not on file  Tobacco Use  . Smoking status: Never Smoker  . Smokeless tobacco: Never Used  Substance and Sexual Activity  . Alcohol use: No  . Drug use: No  . Sexual activity: Never  Other Topics Concern  . Not on file  Social History Narrative  . Not on file   Social Determinants of Health   Financial Resource Strain:   . Difficulty of Paying Living Expenses: Not on file  Food Insecurity:   . Worried About Charity fundraiser in the Last Year: Not on file  . Ran Out of Food in the Last Year: Not on file  Transportation Needs:   . Lack of Transportation (Medical): Not on file  . Lack of Transportation (Non-Medical): Not on file  Physical Activity:   . Days of Exercise per Week: Not on file  . Minutes of Exercise per Session: Not on file  Stress:   . Feeling of Stress : Not on file  Social Connections:   . Frequency of Communication with Friends and Family: Not on file  . Frequency of Social Gatherings with Friends and Family: Not on file  . Attends Religious Services: Not on file  . Active Member of Clubs or Organizations: Not on file  . Attends Archivist Meetings: Not on file  . Marital  Status: Not on file   Family History  Problem Relation Age of Onset  . Heart disease Mother   . Hypertension Mother   . Heart disease Father   . Cancer Brother    Scheduled Meds: . cholecalciferol  2,000 Units Oral Daily  . enoxaparin (LOVENOX) injection  30 mg Subcutaneous Daily  . ipratropium  0.5 mg Nebulization BID  . levalbuterol  0.63 mg Nebulization BID  . levothyroxine  50 mcg Oral Daily  . predniSONE  40 mg Oral Q breakfast  . psyllium  1 packet Oral Daily  . sodium chloride flush  3 mL Intravenous Q12H   Continuous Infusions: . sodium chloride    . cefTRIAXone (ROCEPHIN)  IV 1 g (09/20/19 0824)   PRN Meds:.sodium chloride, acetaminophen **OR** acetaminophen, HYDROmorphone (DILAUDID) injection, ondansetron **OR** ondansetron (ZOFRAN) IV, sodium chloride flush Allergies  Allergen Reactions  . Penicillins Hives    Did it involve swelling of the face/tongue/throat, SOB, or low BP? No Did it involve sudden or  severe rash/hives, skin peeling, or any reaction on the inside of your mouth or nose? Yes Did you need to seek medical attention at a hospital or doctor's office? Unknown When did it last happen?per Johns If all above answers are "NO", may proceed with cephalosporin use.    Review of Systems  Neurological: Positive for weakness.  All other systems reviewed and are negative.   Physical Exam Constitutional:      General: She is not in acute distress. HENT:     Head: Normocephalic and atraumatic.  Cardiovascular:     Rate and Rhythm: Normal rate.  Pulmonary:     Effort: Pulmonary effort is normal. No tachypnea or accessory muscle usage.  Abdominal:     Palpations: Abdomen is soft.  Musculoskeletal:     Right lower leg: Edema present.     Left lower leg: Edema present.  Skin:    General: Skin is warm and dry.  Neurological:     Mental Status: She is alert.     Comments: Easily confused     Vital Signs: BP 117/71 (BP Location: Right Arm)    Pulse (!) 102   Temp (!) 97.4 F (36.3 C) (Axillary)   Resp 20   Ht 5' 3"  (1.6 m)   Wt 52.5 kg   SpO2 99%   BMI 20.50 kg/m  Pain Scale: 0-10   Pain Score: 0-No pain   SpO2: SpO2: 99 % O2 Device:SpO2: 99 % O2 Flow Rate: .O2 Flow Rate (L/min): 4 L/min  IO: Intake/output summary:   Intake/Output Summary (Last 24 hours) at 09/20/2019 1145 Last data filed at 09/19/2019 2138 Gross per 24 hour  Intake 3 ml  Output 400 ml  Net -397 ml    LBM: Last BM Date: 09/17/19 Baseline Weight: Weight: 47.6 kg Most recent weight: Weight: 52.5 kg     Palliative Assessment/Data: PPS 50%    Time Total: 70 minutes Greater than 50%  of this time was spent counseling and coordinating care related to the above assessment and plan.  Juel Burrow, DNP, AGNP-C Palliative Medicine Team [phone] Pager: [phone]

## 2019-09-20 NOTE — Care Management (Addendum)
CM gave referral to [facility] per consult. CM contact both pt and pts sister in room- both remain in agreement.  Sister confirms she will be with pt  24/7 and will work with an agency that they are familiar with to secure additional 24/7 hour assistance.  Both sister and pt aware that [facility] will not provide 24 hour supervision.  Sister plans to transport pt home via private vehicle - no equipment requested at this time.

## 2019-09-20 NOTE — Progress Notes (Addendum)
PROGRESS NOTE    Krystal Johns  NGE:[id number] DOB: 1923-07-19 DOA: 09/17/2019 PCP: Julaine Fusi, NP   Brief Narrative: 86 year old with past medical history significant for hypertension, hypothyroidism, vitamin D deficiency, hyperlipidemia patient complaining of dry cough for the past few days, worsening shortness of breath and wheezing.  She is not on oxygen at home.  Patient was found to be hypoxic oxygen sat 88, she had a bilateral wheezing, chest x-ray showed bilateral infiltrates and pleural effusion suspicious for right lower lobe infiltrate pneumonia.  She was a started on IV Zithromax and Rocephin and Solu-Medrol, currently feels better but is still on O2.    Assessment & Plan:  Principal Problem:   CAP (community acquired pneumonia) Active Problems:   Hypertension   Vitamin D deficiency   Hypothyroidism   Elevated LDL cholesterol level  1-Acute hypoxic respiratory failure: Multifactorial secondary to pneumonia and heart failure exacerbation. New oxygen requirement.  Patient currently requiring 4 L of oxygen to keep oxygen sat above 90 Chest x-ray show bilateral infiltrates and some pleural effusion. Covid test negative. Treating for pneumonia.  BiPAP as needed for increased work of breathing or hypoxemia. Continue with scheduled nebulizer treatments. 2 ECHO; ejection fraction 25%, left ventricle global hypokinesis, right atrial pressure 15 Patient continued to be tachypneic, received one-time dose of IV Laxis 09-18-2019 Will defer further lasix doses to cardiology, recheck BMP this AM.  She does not look grossly fluid overloaded currently.  2-Community-acquired pneumonia: Continue with IV ceftriaxone and Rocephin.  She received a dose of IV Solu-Medrol. On prednisone taper.  3-Acute systolic Heart failure, Cardiomyopathy;  No prior echocardiogram available. Received  one-time dose IV Lasix.  Systolic blood pressure soft. Cardiology consulted. Appreciate assistance.    Holding ACE due to AKI.  Hypertension: Systolic blood pressure soft hold blood pressure medication.  Hypothyroidism: Continue with Synthroid.  AKI:  -Received IV fluids.  We will hold IV fluids to avoid worsening pleural effusion. -Creatinine on admission 1.2.  Prior creatinine 0.9 a year ago. -Suspect component of cardiorenal syndrome. -Cr up to 2. No evidence of urine retention; will await recheck Cr this AM. -Renal US negative for hydronephrosis.  -AKI could be related to hypotension, poor oral intake vs cardiorenal syndrome.  -avoid fluids due to HF.   Prognosis Krystal Johns critical situation of multiple organ failure, heart failure, PNA, now AKI. Plan to continue with antibiotics for now. No scalation of care. IV dilaudid for increase work of breathing. Could use BIPAP if helps with decreasing work of breathing. Plan to consult palliative care for further goals of care.   Estimated body mass index is 18.6 kg/m as calculated from the following:   Height as of this encounter: 5\' 3"  (1.6 m).   Weight as of this encounter: 47.6 kg.  DVT prophylaxis: Lovenox.  Code Status: Patient is DNI, no chest compressions no defibrillation no ACLS medication.    Only thing patient would want is  BiPAP for respiratory distress as needed.  Family Communication:  Previous MD talked with sister, no family present this AM.  Disposition Plan:  Patient from:  Home. Remain in the hospital for treatment of new oxygen requirement acute hypoxic respiratory failure secondary to pneumonia requiring IV antibiotics. New heart failure, now with worsening AKI.   Palliative care consult for goals of care, family is interested in home hospice. Note patient prognosis is less than 6 months and I think she is appropriate for hospice services.   Consultants:   None  Procedures:   Echo  Antimicrobials:    Subjective: Alert oriented no complaints sitting up in chair. Says has intermittent cough denies  fever, chest pain.  Objective: Vitals:   09/17/19 0500 09/17/19 0530 09/17/19 0545 09/17/19 0630  BP: (!) 108/55 120/65 (!) 102/59 110/62  Pulse: 90 97 92 99  Resp: (!) 22 (!) 25 (!) 25 (!) 21  Temp:      TempSrc:      SpO2: 93% 91% 92% 93%  Weight:      Height:       No intake or output data in the 24 hours ending 09/17/19 0744 Filed Weights   09/17/19 0115  Weight: 47.6 kg    Examination:  General exam: Frail, NAD, up in chair comfortable. Respiratory system: Clear to auscultation, no wheezing rhonchi, crackles. Cardiovascular system: S 1, S 2 RRR Gastrointestinal system: BS present, soft, nt Central nervous system: Alert, follows command Extremities: Trace edema Skin: No rashes  Data Reviewed: I have personally reviewed following labs and imaging studies  CBC: Recent Labs  Lab 09/17/19 0219  WBC 9.0  NEUTROABS 7.5  HGB 12.5  HCT 39.4  MCV 96.8  PLT 315   Basic Metabolic Panel: Recent Labs  Lab 09/17/19 0219  NA 139  K 4.3  CL 106  CO2 21*  GLUCOSE 157*  BUN 25*  CREATININE 1.21*  CALCIUM 8.8*   GFR: Estimated Creatinine Clearance: 21.4 mL/min (A) (by C-G formula based on SCr of 1.21 mg/dL (H)). Liver Function Tests: No results for input(s): AST, ALT, ALKPHOS, BILITOT, PROT, ALBUMIN in the last 168 hours. No results for input(s): LIPASE, AMYLASE in the last 168 hours. No results for input(s): AMMONIA in the last 168 hours. Coagulation Profile: No results for input(s): INR, PROTIME in the last 168 hours. Cardiac Enzymes: No results for input(s): CKTOTAL, CKMB, CKMBINDEX, TROPONINI in the last 168 hours. BNP (last 3 results) No results for input(s): PROBNP in the last 8760 hours. HbA1C: No results for input(s): HGBA1C in the last 72 hours. CBG: No results for input(s): GLUCAP in the last 168 hours. Lipid Profile: No results for input(s): CHOL, HDL, LDLCALC, TRIG, CHOLHDL, LDLDIRECT in the last 72 hours. Thyroid Function Tests: No results for  input(s): TSH, T4TOTAL, FREET4, T3FREE, THYROIDAB in the last 72 hours. Anemia Panel: No results for input(s): VITAMINB12, FOLATE, FERRITIN, TIBC, IRON, RETICCTPCT in the last 72 hours. Sepsis Labs: Recent Labs  Lab 09/17/19 0224  LATICACIDVEN 1.9    Recent Results (from the past 240 hour(s))  Respiratory Panel by RT PCR (Flu A&B, Covid) - Nasopharyngeal Swab     Status: None   Collection Time: 09/17/19  2:44 AM   Specimen: Nasopharyngeal Swab  Result Value Ref Range Status   SARS Coronavirus 2 by RT PCR NEGATIVE NEGATIVE Final    Comment: (NOTE) SARS-CoV-2 target nucleic acids are NOT DETECTED. The SARS-CoV-2 RNA is generally detectable in upper respiratoy specimens during the acute phase of infection. The lowest concentration of SARS-CoV-2 viral copies this assay can detect is 131 copies/mL. A negative result does not preclude SARS-Cov-2 infection and should not be used as the sole basis for treatment or other patient management decisions. A negative result may occur with  improper specimen collection/handling, submission of specimen other than nasopharyngeal swab, presence of viral mutation(s) within the areas targeted by this assay, and inadequate number of viral copies (<131 copies/mL). A negative result must be combined with clinical observations, patient history, and epidemiological information. The expected result  is Negative. Fact Sheet for Patients:  https://www.moore.com/ Fact Sheet for Healthcare Providers:  https://www.young.biz/ This test is not yet ap proved or cleared by the [place] FDA and  has been authorized for detection and/or diagnosis of SARS-CoV-2 by FDA under an Emergency Use Authorization (EUA). This EUA will remain  in effect (meaning this test can be used) for the duration of the COVID-19 declaration under Section 564(b)(1) of the Act, 21 U.S.C. section 360bbb-3(b)(1), unless the authorization is terminated  or revoked sooner.    Influenza A by PCR NEGATIVE NEGATIVE Final   Influenza B by PCR NEGATIVE NEGATIVE Final    Comment: (NOTE) The Xpert Xpress SARS-CoV-2/FLU/RSV assay is intended as an aid in  the diagnosis of influenza from Nasopharyngeal swab specimens and  should not be used as a sole basis for treatment. Nasal washings and  aspirates are unacceptable for Xpert Xpress SARS-CoV-2/FLU/RSV  testing. Fact Sheet for Patients: https://www.moore.com/ Fact Sheet for Healthcare Providers: https://www.young.biz/ This test is not yet approved or cleared by the [place] FDA and  has been authorized for detection and/or diagnosis of SARS-CoV-2 by  FDA under an Emergency Use Authorization (EUA). This EUA will remain  in effect (meaning this test can be used) for the duration of the  Covid-19 declaration under Section 564(b)(1) of the Act, 21  U.S.C. section 360bbb-3(b)(1), unless the authorization is  terminated or revoked. Performed at [facility], [address] [place], [place] [address]          Radiology Studies: DG Chest Port 1 View  Result Date: 09/17/2019 CLINICAL DATA:  Hypoxia and shortness of breath. EXAM: PORTABLE CHEST 1 VIEW COMPARISON:  12/06/2014 FINDINGS: Low lung volumes. Bilateral pleural effusions, left greater than right with associated bibasilar opacities. Upper normal heart size, partially obscured by pleural fluid. Aortic atherosclerosis. Mild vascular congestion possible mild septal thickening/pulmonary edema. No pneumothorax. Bones are under mineralized. IMPRESSION: 1. Bilateral pleural effusions, left greater than right, with associated bibasilar opacities, likely atelectasis, however pneumonia is also is considered particularly at the right lung base. 2. Mild vascular congestion and possible mild pulmonary edema. Aortic Atherosclerosis (ICD10-I70.0). Electronically Signed   By: Narda Rutherford M.D.   On:  09/17/2019 02:14        Scheduled Meds: . albuterol  2.5 mg Inhalation Q6H  . cholecalciferol  2,000 Units Oral Daily  . enoxaparin (LOVENOX) injection  30 mg Subcutaneous Daily  . levothyroxine  50 mcg Oral Daily  . lisinopril  10 mg Oral Daily  . predniSONE  40 mg Oral Q breakfast  . sodium chloride flush  3 mL Intravenous Q12H   Continuous Infusions: . sodium chloride 75 mL/hr at 09/17/19 0641  . sodium chloride    . [START ON 09/18/2019] azithromycin    . [START ON 09/18/2019] cefTRIAXone (ROCEPHIN)  IV       LOS: 0 days    Time spent: 35 miutes  [facility]  If 7PM-7AM, please contact night-coverage  09/17/2019, 7:44 AM

## 2019-09-20 NOTE — Evaluation (Signed)
Physical Therapy Evaluation Patient Details Name: Krystal Johns MRN: [id number] DOB: [birth year] Today's Date: 09/20/2019   History of Present Illness  Pt is a pleasant 86 yo female with a history of HTN, HLD, hypothyroidism, and systolic heart failure, who presented to ED 1/31 with c/o SOB and wheezing. Upon admission, the pt was found to be hypoxic (SpO2 88% on RA), and dx with community aquired pneumonia in addition to multiple organ failure.  Clinical Impression  Pt OOB in recliner with sister present upon PT arrival, agreeable to PT evaluation at this time. The pt presents with limitations in functional mobility, stability, and endurance compared to her prior level of function and independence due to above dx and subsequent debility. The pt was able to demo good transfers and ambulation with use of RW, but requires cues for improved use of RW and posture. The pt will continue to benefit from skilled PT both acutely and after d/c to maximize strength, stability, and return to prior level of independence without use of AD.     Follow Up Recommendations Home health PT;Supervision/Assistance - 24 hour    Equipment Recommendations  Rolling walker with 5" wheels(RW for 5'3" pt)    Recommendations for Other Services       Precautions / Restrictions Precautions Precautions: Fall Precaution Comments: watch SpO2 Restrictions Weight Bearing Restrictions: No      Mobility  Bed Mobility Overal bed mobility: Needs Assistance             General bed mobility comments: pt OOB in recliner upon PT arrival  Transfers Overall transfer level: Needs assistance Equipment used: Rolling walker (2 wheeled);1 person hand held assist Transfers: Sit to/from Stand Sit to Stand: Min guard         General transfer comment: minG with RW for stability upon rise, minA with HHA to come to stand  Ambulation/Gait Ambulation/Gait assistance: Min guard Gait Distance (Feet): 100 Feet Assistive device:  Rolling walker (2 wheeled) Gait Pattern/deviations: Step-through pattern;Decreased stride length;Staggering right;Trunk flexed Gait velocity: decreased Gait velocity interpretation: <1.8 ft/sec, indicate of risk for recurrent falls General Gait Details: Pt with sig trunk flexion and poor positioning in RW despite VCs to stand closer to RW (possibly due to RW height), no LOB with ambulation, but pt with difficulty managing RW in tight spaces  Stairs            Wheelchair Mobility    Modified Rankin (Stroke Patients Only)       Balance Overall balance assessment: Needs assistance Sitting-balance support: No upper extremity supported;Feet supported Sitting balance-Leahy Scale: Good Sitting balance - Comments: supervision   Standing balance support: Single extremity supported;Bilateral upper extremity supported;During functional activity Standing balance-Leahy Scale: Poor Standing balance comment: pt able to stand with single UE support, but prefers BUE support at this time                             Pertinent Vitals/Pain Pain Assessment: No/denies pain    Home Living Family/patient expects to be discharged to:: Private residence Living Arrangements: Alone Available Help at Discharge: Family;Available PRN/intermittently;Available 24 hours/day(pt sister is available, and planning to arrange 24/7 care) Type of Home: House Home Access: Stairs to enter;Ramped entrance Entrance Stairs-Rails: Right Entrance Stairs-Number of Steps: ramp at front, 2 stairs in garage with rail on R Home Layout: One level Home Equipment: [place] - 4 wheels;Walker - 2 wheels;Bedside commode Additional Comments: Pt sister has a RW from  the pt's husband, may need RW for pt given her height.    Prior Function Level of Independence: Independent         Comments: Per pt and sister, pt was independent PTA with cooking, cleaning, dressing, and bathing (did use bird bath rather than shower).  Pt sister helps with driving/errands     Hand Dominance   Dominant Hand: Right    Extremity/Trunk Assessment   Upper Extremity Assessment Upper Extremity Assessment: Defer to OT evaluation    Lower Extremity Assessment Lower Extremity Assessment: Generalized weakness    Cervical / Trunk Assessment Cervical / Trunk Assessment: Kyphotic  Communication   Communication: HOH  Cognition Arousal/Alertness: Awake/alert Behavior During Therapy: WFL for tasks assessed/performed Overall Cognitive Status: Within Functional Limits for tasks assessed                                 General Comments: Pt very agreeable and easygoing. Generally functional, some STM deficits (forgetting room number) as well as incorrect history (pt reports she has used a RW while sister states this is not true)      General Comments      Exercises     Assessment/Plan    PT Assessment Patient needs continued PT services  PT Problem List Decreased strength;Decreased mobility;Decreased coordination;Decreased activity tolerance;Cardiopulmonary status limiting activity;Decreased balance;Decreased knowledge of use of DME       PT Treatment Interventions DME instruction;Gait training;Therapeutic exercise;Balance training;Stair training;Functional mobility training;Therapeutic activities;Patient/family education    PT Goals (Current goals can be found in the Care Plan section)  Acute Rehab PT Goals Patient Stated Goal: return home to her yard work PT Goal Formulation: With patient Time For Goal Achievement: 10/04/19 Potential to Achieve Goals: Good    Frequency Min 3X/week   Barriers to discharge        Co-evaluation               AM-PAC PT "6 Clicks" Mobility  Outcome Measure Help needed turning from your back to your side while in a flat bed without using bedrails?: A Little Help needed moving from lying on your back to sitting on the side of a flat bed without using  bedrails?: A Little Help needed moving to and from a bed to a chair (including a wheelchair)?: A Little Help needed standing up from a chair using your arms (e.g., wheelchair or bedside chair)?: A Little Help needed to walk in hospital room?: A Little Help needed climbing 3-5 steps with a railing? : A Lot 6 Click Score: 17    End of Session Equipment Utilized During Treatment: Gait belt Activity Tolerance: Patient tolerated treatment well;Patient limited by fatigue Patient left: in chair;with chair alarm set;with family/visitor present;with call bell/phone within reach Nurse Communication: Mobility status PT Visit Diagnosis: Difficulty in walking, not elsewhere classified (R26.2)    Time: 0762-2633 PT Time Calculation (min) (ACUTE ONLY): 30 min   Charges:   PT Evaluation $PT Eval Low Complexity: 1 Low PT Treatments $Gait Training: 8-22 mins        Rolm Baptise, PT, DPT   Acute Rehabilitation Department Pager #: [phone]  Gaetana Michaelis 09/20/2019, 1:25 PM

## 2019-09-21 ENCOUNTER — Telehealth: Payer: Self-pay | Admitting: Adult Health

## 2019-09-21 LAB — BASIC METABOLIC PANEL
Anion gap: 12 (ref 5–15)
BUN: 69 mg/dL — ABNORMAL HIGH (ref 8–23)
CO2: 19 mmol/L — ABNORMAL LOW (ref 22–32)
Calcium: 8.6 mg/dL — ABNORMAL LOW (ref 8.9–10.3)
Chloride: 106 mmol/L (ref 98–111)
Creatinine, Ser: 1.56 mg/dL — ABNORMAL HIGH (ref 0.44–1.00)
GFR calc Af Amer: 33 mL/min — ABNORMAL LOW (ref >60)
GFR calc non Af Amer: 28 mL/min — ABNORMAL LOW (ref >60)
Glucose, Bld: 116 mg/dL — ABNORMAL HIGH (ref 70–99)
Potassium: 4.9 mmol/L (ref 3.5–5.1)
Sodium: 137 mmol/L (ref 135–145)

## 2019-09-21 LAB — CBC
HCT: 36.4 % (ref 36.0–46.0)
Hemoglobin: 12 g/dL (ref 12.0–15.0)
MCH: 30.8 pg (ref 26.0–34.0)
MCHC: 33 g/dL (ref 30.0–36.0)
MCV: 93.3 fL (ref 80.0–100.0)
Platelets: 260 10*3/uL (ref 150–400)
RBC: 3.9 MIL/uL (ref 3.87–5.11)
RDW: 13.4 % (ref 11.5–15.5)
WBC: 9.8 10*3/uL (ref 4.0–10.5)
nRBC: 0 % (ref 0.0–0.2)

## 2019-09-21 MED ORDER — FUROSEMIDE 40 MG PO TABS: 40.0000 mg | ORAL_TABLET | Freq: Every day | ORAL | 0 refills | Status: AC

## 2019-09-21 MED ORDER — PSYLLIUM 95 % PO PACK: 1.0000 | PACK | Freq: Every day | ORAL | 0 refills | Status: AC

## 2019-09-21 MED ORDER — AZITHROMYCIN 250 MG PO TABS: ORAL_TABLET | ORAL | 0 refills | Status: AC

## 2019-09-21 MED ORDER — PREDNISONE 20 MG PO TABS: 20.0000 mg | ORAL_TABLET | Freq: Every day | ORAL | 0 refills | Status: AC

## 2019-09-21 NOTE — Telephone Encounter (Signed)
Candise Bowens called frm Marcell Anger [phone] says pt being D/C today from Hospital into Hospital program & wishes provider to remain her provider (advised them Orpha Bur will be leaving practice 2/19 & would forward message to med asst to contact them).to advise who will be taking over Krystal Johns's patient case load.  --glh

## 2019-09-21 NOTE — Telephone Encounter (Signed)
Pt's caregiver called states even though pt under Hosp pallalitive care [facility]) they need to know if she is to continue to be given :  Thyroid Meds-- Levothyroxine  &   Vit D   --Forwarding message to med asst for review w/ provider for clarification instructions to caregiver.   call Marvis Moeller @ [phone]  -glh

## 2019-09-21 NOTE — Telephone Encounter (Signed)
Good Evening Tonya, Please advise that if she can swallow- she should continue all medications as directed. Please tell the family that they are in our thoughts and prayers. Thanks! Orpha Bur

## 2019-09-21 NOTE — Progress Notes (Signed)
[facility] Documentation  Pt approved for in home hospice services. Oxygen ordered through [facility].   Please send signed and completed DNR form home with patient/family. Patient will need prescriptions for discharge comfort medications.  Pt to have hospice admission visit tomorrow at 2:30pm. [facility] referral center to arrange.   Please do not hesitate to outreach with any questions.   Thank you,  Trena Platt, RN [facility] Liaison  [phone]

## 2019-09-21 NOTE — Telephone Encounter (Signed)
Please advise.  T. Coleen Cardiff, CMA 

## 2019-09-21 NOTE — TOC Transition Note (Signed)
Transition of Care [facility]) - CM/SW Discharge Note   Patient Details  Name: Krystal Johns MRN: [id number] Date of Birth: 1923/04/29  [facility] CM/SW Contact:  Lawerance Sabal, RN Phone Number: 09/21/2019, 9:34 AM   Clinical Narrative:    Krystal Johns [phone]  Plan for DC today. Spoke w sister Krystal Johns who states that she will accomidate patient at her house. Address: [address] [place] [address]. Notifed Krystal Johns [facility] that patient will DC today.  Patient has access to step in shower, shower seat, 3/1, RW, in her sister's single story house.  DME needed- Home O2. Tank for transport home via private car will be delivered to hospital room prior to patient leaving. O2 concentrator will be delivered to home and Jeanette's spouse will be home to accept delivery.      Final next level of care: Home w Hospice Care Barriers to Discharge: No Barriers Identified   Patient Goals and CMS Choice Patient states their goals for this hospitalization and ongoing recovery are:: sister Krystal Johns- to go home CMS Medicare.gov Compare Post Acute Care list provided to:: Other (Comment Required) Choice offered to / list presented to : Sibling  Discharge Placement                       Discharge Plan and Services                DME Arranged: Oxygen DME Agency: [facility] Date DME Agency Contacted: 09/21/19 Time DME Agency Contacted: [phone] Representative spoke with at DME Agency: Krystal Johns   [facility] Agency: [facility] Date [facility] Agency Contacted: 09/21/19 Time HH Agency Contacted: [phone] Representative spoke with at [facility] Agency: Krystal Johns  Social Determinants of Health (SDOH) Interventions     Readmission Risk Interventions No flowsheet data found.

## 2019-09-21 NOTE — Progress Notes (Signed)
Occupational Therapy Treatment Patient Details Name: Krystal Johns MRN: [id number] DOB: [birth year] Today's Date: 09/21/2019    History of present illness Pt is a pleasant 86 yo female with a history of HTN, HLD, hypothyroidism, and systolic heart failure, who presented to ED 1/31 with c/o SOB and wheezing. Upon admission, the pt was found to be hypoxic (SpO2 88% on RA), and dx with community aquired pneumonia in addition to multiple organ failure.   OT comments  Patient supine in bed and agreeable to OT session.  Seen to progress towards PLOF with activity tolerance and ADL independence.  Bed mobility with mod assist and LB ADLs with min assist today.  Patient limited by fatigue and once seated EOB requested to return supine. Mild confusion noted this morning, reporting she was "in the kitchen" and therapist re-oriented patient.  On 2L with VSS, but noted wheezing today. Patient plans to dc home with 24/7 support. Will follow acutely.     Follow Up Recommendations  Home health OT;Supervision/Assistance - 24 hour    Equipment Recommendations  None recommended by OT    Recommendations for Other Services      Precautions / Restrictions Precautions Precautions: Fall Precaution Comments: watch SpO2 Restrictions Weight Bearing Restrictions: No       Mobility Bed Mobility Overal bed mobility: Needs Assistance Bed Mobility: Sit to Supine;Supine to Sit     Supine to sit: Mod assist;HOB elevated Sit to supine: Min assist   General bed mobility comments: patient transitioned to EOB with mod support for trunk and scooting foward, increased time to manage LBs   Transfers                 General transfer comment: pt requested to return supine     Balance Overall balance assessment: Needs assistance Sitting-balance support: No upper extremity supported;Feet supported Sitting balance-Leahy Scale: Good Sitting balance - Comments: min guard dynamically reaching forward today                                     ADL either performed or assessed with clinical judgement   ADL Overall ADL's : Needs assistance/impaired                     Lower Body Dressing: Minimal assistance;Sitting/lateral leans Lower Body Dressing Details (indicate cue type and reason): min assist to don socks with increased effort this am, pt requested to return supine              Functional mobility during ADLs: Moderate assistance(limited to EOB only )       Vision       Perception     Praxis      Cognition Arousal/Alertness: Awake/alert;Lethargic Behavior During Therapy: WFL for tasks assessed/performed Overall Cognitive Status: Impaired/Different from baseline Area of Impairment: Orientation;Memory                 Orientation Level: Disoriented to;Place   Memory: Decreased short-term memory         General Comments: patient lethargic initally, agreeable to OT and once seated EOB requests to return supine; re-oriented to hopsital this am, reports "in the kitchen"         Exercises     Shoulder Instructions       General Comments pt on 2L via [place], SpO2 98-100% throughout session but noted wheezing today     Pertinent Vitals/  Pain       Pain Assessment: No/denies pain  Home Living                                          Prior Functioning/Environment              Frequency  Min 2X/week        Progress Toward Goals  OT Goals(current goals can now be found in the care plan section)  Progress towards OT goals: Progressing toward goals  Acute Rehab OT Goals Patient Stated Goal: return home to her yard work OT Goal Formulation: With patient  Plan Discharge plan remains appropriate;Frequency remains appropriate    Co-evaluation                 AM-PAC OT "6 Clicks" Daily Activity     Outcome Measure   Help from another person eating meals?: None Help from another person taking care of  personal grooming?: A Little Help from another person toileting, which includes using toliet, bedpan, or urinal?: A Little Help from another person bathing (including washing, rinsing, drying)?: A Little Help from another person to put on and taking off regular upper body clothing?: A Little Help from another person to put on and taking off regular lower body clothing?: A Little 6 Click Score: 19    End of Session Equipment Utilized During Treatment: Oxygen  OT Visit Diagnosis: Other abnormalities of gait and mobility (R26.89);Muscle weakness (generalized) (M62.81)   Activity Tolerance Patient limited by fatigue   Patient Left in bed;with call bell/phone within reach;with bed alarm set   Nurse Communication Mobility status        Time: 7824-2353 OT Time Calculation (min): 19 min  Charges: OT General Charges $OT Visit: 1 Visit OT Treatments $Self Care/Home Management : 8-22 mins  Jolaine Artist, OT Acute Rehabilitation Services Pager [phone] Office [place] 09/21/2019, 11:19 AM

## 2019-09-21 NOTE — Telephone Encounter (Signed)
Per your conversation with Haskell Riling, practice administrator, earlier today, please call [facility] back and inform them that Dr. Sharee Holster will be assuming care for this patient.  Krystal Johns, CMA

## 2019-09-21 NOTE — Discharge Summary (Addendum)
Discharge Summary  Krystal Johns VOZ:[id number] DOB: [birth year]  PCP: William Hamburger D, NP  Admit date: 09/17/2019 Discharge date: 09/21/2019   Recommendations for Outpatient Follow-up:  1. Hospice   Discharge Diagnoses:  Active Hospital Problems   Diagnosis Date Noted   CAP (community acquired pneumonia) 09/17/2019   AKI (acute kidney injury) [facility])    Goals of care, counseling/discussion    Palliative care by specialist    DNR (do not resuscitate)    Pneumonia 09/17/2019   Elevated LDL cholesterol level 05/26/2018   Hypothyroidism 11/22/2014   Hypertension 02/14/2011   Vitamin D deficiency 02/14/2011    Resolved Hospital Problems  No resolved problems to display.    Discharge Condition: Stable currently but has prognosis of less than six months.   Diet recommendation: As tolerated   Vitals:   09/21/19 0325 09/21/19 0746  BP:  103/78  Pulse:  89  Resp:  19  Temp: 98 F (36.7 C) (!) 97.1 F (36.2 C)  SpO2:  99%    HPI and Brief Hospital Course:  86 year old with past medical history significant for hypertension, hypothyroidism, vitamin D deficiency, hyperlipidemia patient complaining of dry cough for the past few days, worsening shortness of breath and wheezing.  She is not on oxygen at home.  Patient was found to be hypoxic oxygen sat 88, she had a bilateral wheezing, chest x-ray showed bilateral infiltrates and pleural effusion suspicious for right lower lobe infiltrate pneumonia.  She was a started on IV Zithromax and Rocephin and Solu-Medrol, currently feels better but is still on O2.    Family meeting 2/3 family decided to take her home with hospice services. She will be discharged home today with family, to complete course of oral antibiotics and oral daily lasix for comfort. She will also be sent home with oxygen for comfort.  Discharge details, plan of care and follow up instructions were discussed with patient and any available family or care  providers. Patient and family are in agreement with discharge from the hospital today and all questions were answered to their satisfaction.  Consultations:  Cardiology  Palliative care   Discharge Exam: BP 103/78 (BP Location: Right Arm)    Pulse 89    Temp (!) 97.1 F (36.2 C) (Axillary)    Resp 19    Ht 5\' 3"  (1.6 m)    Wt 52.5 kg    SpO2 99%    BMI 20.50 kg/m  General:  Alert, oriented, calm, in no acute distress, on 2L O2 Cardiovascular: RRR, no murmurs or rubs, no peripheral edema  Respiratory: distant break sounds, some basilar crackles Abdomen: soft, nontender, nondistended, normal bowel tones heard  Neurologic: extraocular muscles intact, clear speech, moving all extremities with intact sensorium   Discharge Instructions You were cared for by a hospitalist during your hospital stay. If you have any questions about your discharge medications or the care you received while you were in the hospital after you are discharged, you can call the unit and asked to speak with the hospitalist on call if the hospitalist that took care of you is not available. Once you are discharged, your primary care physician will handle any further medical issues. Please note that NO REFILLS for any discharge medications will be authorized once you are discharged, as it is imperative that you return to your primary care physician (or establish a relationship with a primary care physician if you do not have one) for your aftercare needs so that they can reassess your  need for medications and monitor your lab values.  Discharge Instructions    Increase activity slowly   Complete by: As directed      Allergies as of 09/21/2019      Reactions   Penicillins Hives   Did it involve swelling of the face/tongue/throat, SOB, or low BP? No Did it involve sudden or severe rash/hives, skin peeling, or any reaction on the inside of your mouth or nose? Yes Did you need to seek medical attention at a hospital or  doctor's office? Unknown When did it last happen?per sister If all above answers are NO, may proceed with cephalosporin use.      Medication List    STOP taking these medications   levothyroxine 50 MCG tablet Commonly known as: SYNTHROID    lisinopril 10 MG tablet Commonly known as: ZESTRIL   Vitamin D3 50 MCG (2000 UT) Tabs     TAKE these medications   furosemide 40 MG tablet Commonly known as: Lasix Take 1 tablet (40 mg total) by mouth daily for 14 days.   predniSONE 20 MG tablet Commonly known as: DELTASONE Take 1 tablet (20 mg total) by mouth daily with breakfast for 3 days.  Azithromycin 250mg  tablet 5 days   psyllium 95 % Pack Commonly known as: HYDROCIL/METAMUCIL Take 1 packet by mouth daily for 14 days.      Allergies  Allergen Reactions   Penicillins Hives    Did it involve swelling of the face/tongue/throat, SOB, or low BP? No Did it involve sudden or severe rash/hives, skin peeling, or any reaction on the inside of your mouth or nose? Yes Did you need to seek medical attention at a hospital or doctor's office? Unknown When did it last happen?per sister If all above answers are NO, may proceed with cephalosporin use.       The results of significant diagnostics from this hospitalization (including imaging, microbiology, ancillary and laboratory) are listed below for reference.    Significant Diagnostic Studies: RENAL  Result Date: 09/19/2019 CLINICAL DATA:  Acute kidney injury EXAM: RENAL / URINARY TRACT ULTRASOUND COMPLETE COMPARISON:  None. FINDINGS: Right Kidney: Renal measurements: 7.4 x 3.7 x 3.9 cm = volume: 54 mL . Echogenicity within normal limits. No mass or hydronephrosis visualized. Left Kidney: Renal measurements: 7.4 x 4.1 x 4.1 cm = volume: 65 mL. Echogenicity within normal limits. No mass or hydronephrosis visualized. Bladder: Thick-walled/decompressed. Other: Left pleural effusion. IMPRESSION: Small bilateral kidneys.   No hydronephrosis. Bladder is thick-walled although decompressed. Electronically Signed   By: 11/17/2019 M.D.   On: 09/19/2019 11:56   DG Chest Port 1 View  Result Date: 09/17/2019 CLINICAL DATA:  Hypoxia and shortness of breath. EXAM: PORTABLE CHEST 1 VIEW COMPARISON:  12/06/2014 FINDINGS: Low lung volumes. Bilateral pleural effusions, left greater than right with associated bibasilar opacities. Upper normal heart size, partially obscured by pleural fluid. Aortic atherosclerosis. Mild vascular congestion possible mild septal thickening/pulmonary edema. No pneumothorax. Bones are under mineralized. IMPRESSION: 1. Bilateral pleural effusions, left greater than right, with associated bibasilar opacities, likely atelectasis, however pneumonia is also is considered particularly at the right lung base. 2. Mild vascular congestion and possible mild pulmonary edema. Aortic Atherosclerosis (ICD10-I70.0). Electronically Signed   By: 12/08/2014 M.D.   On: 09/17/2019 02:14   ECHOCARDIOGRAM COMPLETE  Result Date: 09/17/2019   ECHOCARDIOGRAM REPORT   Patient Name:   ASHAYA RAFTERY Date of Exam: 09/17/2019 Medical Rec #:  09/19/2019     Height:  63.0 in Accession #:    [id number]    Weight:       105.0 lb Date of Birth:  1923/05/20      BSA:          1.47 m Patient Age:    89 years      BP:           93/79 mmHg Patient Gender: F             HR:           109 bpm. Exam Location:  Inpatient Procedure: 2D Echo, Color Doppler and Cardiac Doppler Indications:    R06.9 DOE  History:        Patient has no prior history of Echocardiogram examinations.                 Signs/Symptoms:Dyspnea; Risk Factors:Hypertension and                 Dyslipidemia.  Sonographer:    Irving Burton Senior RDCS Referring Phys: [id number] MIRCEA G CRISTESCU IMPRESSIONS  1. Left ventricular ejection fraction, by visual estimation, is 25 to 30%. The left ventricle has severely decreased function. There is no left ventricular hypertrophy.  2. Left  ventricular diastolic parameters are indeterminate.  3. Mild to moderately dilated left ventricular internal cavity size.  4. The left ventricle demonstrates global hypokinesis.  5. Global right ventricle has normal systolic function.The right ventricular size is normal. No increase in right ventricular wall thickness.  6. Left atrial size was severely dilated.  7. Right atrial size was normal.  8. The pericardial effusion is circumferential.  9. Trivial pericardial effusion is present. 10. Mild mitral annular calcification. 11. The mitral valve is normal in structure. Mild to moderate mitral valve regurgitation. No evidence of mitral stenosis. 12. The tricuspid valve is normal in structure. 13. The tricuspid valve is normal in structure. Tricuspid valve regurgitation is trivial. 14. The aortic valve is tricuspid. Aortic valve regurgitation is mild. No evidence of aortic valve sclerosis or stenosis. 15. The pulmonic valve was not well visualized. Pulmonic valve regurgitation is not visualized. 16. Moderately elevated pulmonary artery systolic pressure. 17. The inferior vena cava is dilated in size with <50% respiratory variability, suggesting right atrial pressure of 15 mmHg. FINDINGS  Left Ventricle: Left ventricular ejection fraction, by visual estimation, is 25 to 30%. The left ventricle has severely decreased function. The left ventricle demonstrates global hypokinesis. The left ventricular internal cavity size was mildly to moderately dilated left ventricle. There is no left ventricular hypertrophy. Left ventricular diastolic parameters are indeterminate. Right Ventricle: The right ventricular size is normal. No increase in right ventricular wall thickness. Global RV systolic function is has normal systolic function. The tricuspid regurgitant velocity is 2.62 m/s, and with an assumed right atrial pressure  of 15 mmHg, the estimated right ventricular systolic pressure is moderately elevated at 42.5 mmHg. Left  Atrium: Left atrial size was severely dilated. Right Atrium: Right atrial size was normal in size Pericardium: Trivial pericardial effusion is present. The pericardial effusion is circumferential. Left pleural effusion. Mitral Valve: The mitral valve is normal in structure. There is mild thickening of the mitral valve leaflet(s). Mild mitral annular calcification. Mild to moderate mitral valve regurgitation. No evidence of mitral valve stenosis by observation. MV peak gradient, 12.2 mmHg. Tricuspid Valve: The tricuspid valve is normal in structure. Tricuspid valve regurgitation is trivial. Aortic Valve: The aortic valve is tricuspid. . There is mild thickening and mild calcification  of the aortic valve. Aortic valve regurgitation is mild. The aortic valve is structurally normal, with no evidence of sclerosis or stenosis. Mild aortic valve annular calcification. There is mild thickening of the aortic valve. There is mild calcification of the aortic valve. Aortic valve mean gradient measures 8.7 mmHg. Aortic valve peak gradient measures 14.7 mmHg. Aortic valve area, by VTI measures 1.22 cm. Pulmonic Valve: The pulmonic valve was not well visualized. Pulmonic valve regurgitation is not visualized. Pulmonic regurgitation is not visualized. No evidence of pulmonic stenosis. Aorta: The aortic root is normal in size and structure. Pulmonary Artery: Indeterminant PASP, inadequate TR jet. Venous: The inferior vena cava is dilated in size with less than 50% respiratory variability, suggesting right atrial pressure of 15 mmHg. IAS/Shunts: No atrial level shunt detected by color flow Doppler.  LEFT VENTRICLE PLAX 2D LVIDd:         6.40 cm       Diastology LVIDs:         5.40 cm       LV e' lateral:   10.30 cm/s LV PW:         0.60 cm       LV E/e' lateral: 15.0 LV IVS:        0.60 cm       LV e' medial:    9.36 cm/s LVOT diam:     2.00 cm       LV E/e' medial:  16.5 LV SV:         67 ml LV SV Index:   46.39 LVOT Area:     3.14  cm  LV Volumes (MOD) LV area d, A2C:    50.10 cm LV area d, A4C:    43.30 cm LV area s, A2C:    41.20 cm LV area s, A4C:    35.80 cm LV major d, A2C:   9.51 cm LV major d, A4C:   8.73 cm LV major s, A2C:   8.70 cm LV major s, A4C:   8.01 cm LV vol d, MOD A2C: 218.0 ml LV vol d, MOD A4C: 174.0 ml LV vol s, MOD A2C: 162.0 ml LV vol s, MOD A4C: 132.0 ml LV SV MOD A2C:     56.0 ml LV SV MOD A4C:     174.0 ml LV SV MOD BP:      51.4 ml RIGHT VENTRICLE RV S prime:     10.80 cm/s TAPSE (M-mode): 1.8 cm LEFT ATRIUM             Index       RIGHT ATRIUM           Index LA diam:        3.60 cm 2.45 cm/m  RA Area:     13.80 cm LA Vol (A2C):   71.0 ml 48.29 ml/m RA Volume:   36.70 ml  24.96 ml/m LA Vol (A4C):   61.1 ml 41.55 ml/m LA Biplane Vol: 67.1 ml 45.63 ml/m  AORTIC VALVE AV Area (Vmax):    1.16 cm AV Area (Vmean):   1.12 cm AV Area (VTI):     1.22 cm AV Vmax:           192.00 cm/s AV Vmean:          136.000 cm/s AV VTI:            0.328 m AV Peak Grad:      14.7 mmHg AV Mean Grad:  8.7 mmHg LVOT Vmax:         71.10 cm/s LVOT Vmean:        48.700 cm/s LVOT VTI:          0.127 m LVOT/AV VTI ratio: 0.39  AORTA Ao Root diam: 3.00 cm Ao Asc diam:  2.80 cm MITRAL VALVE                        TRICUSPID VALVE MV Area (PHT): 5.84 cm             TR Peak grad:   27.5 mmHg MV Peak grad:  12.2 mmHg            TR Vmax:        262.00 cm/s MV Mean grad:  6.0 mmHg MV Vmax:       1.75 m/s             SHUNTS MV Vmean:      117.0 cm/s           Systemic VTI:  0.13 m MV VTI:        0.22 m               Systemic Diam: 2.00 cm MV PHT:        37.70 msec MV Decel Time: 130 msec MV E velocity: 154.00 cm/s 103 cm/s  Dina Rich MD Electronically signed by Dina Rich MD Signature Date/Time: 09/17/2019/2:14:52 PM    Final     Microbiology: Recent Results (from the past 240 hour(s))  Respiratory Panel by RT PCR (Flu A&B, Covid) - Nasopharyngeal Swab     Status: None   Collection Time: 09/17/19  2:44 AM   Specimen:  Nasopharyngeal Swab  Result Value Ref Range Status   SARS Coronavirus 2 by RT PCR NEGATIVE NEGATIVE Final    Comment: (NOTE) SARS-CoV-2 target nucleic acids are NOT DETECTED. The SARS-CoV-2 RNA is generally detectable in upper respiratoy specimens during the acute phase of infection. The lowest concentration of SARS-CoV-2 viral copies this assay can detect is 131 copies/mL. A negative result does not preclude SARS-Cov-2 infection and should not be used as the sole basis for treatment or other patient management decisions. A negative result may occur with  improper specimen collection/handling, submission of specimen other than nasopharyngeal swab, presence of viral mutation(s) within the areas targeted by this assay, and inadequate number of viral copies (<131 copies/mL). A negative result must be combined with clinical observations, patient history, and epidemiological information. The expected result is Negative. Fact Sheet for Patients:  https://www.moore.com/ Fact Sheet for Healthcare Providers:  https://www.young.biz/ This test is not yet ap proved or cleared by the [place] FDA and  has been authorized for detection and/or diagnosis of SARS-CoV-2 by FDA under an Emergency Use Authorization (EUA). This EUA will remain  in effect (meaning this test can be used) for the duration of the COVID-19 declaration under Section 564(b)(1) of the Act, 21 U.S.C. section 360bbb-3(b)(1), unless the authorization is terminated or revoked sooner.    Influenza A by PCR NEGATIVE NEGATIVE Final   Influenza B by PCR NEGATIVE NEGATIVE Final    Comment: (NOTE) The Xpert Xpress SARS-CoV-2/FLU/RSV assay is intended as an aid in  the diagnosis of influenza from Nasopharyngeal swab specimens and  should not be used as a sole basis for treatment. Nasal washings and  aspirates are unacceptable for Xpert Xpress SARS-CoV-2/FLU/RSV  testing. Fact Sheet for  Patients: https://www.moore.com/ Fact Sheet  for Healthcare Providers: https://www.young.biz/https://www.fda.gov/media/142435/download This test is not yet approved or cleared by the [place] FDA and  has been authorized for detection and/or diagnosis of SARS-CoV-2 by  FDA under an Emergency Use Authorization (EUA). This EUA will remain  in effect (meaning this test can be used) for the duration of the  Covid-19 declaration under Section 564(b)(1) of the Act, 21  U.S.C. section 360bbb-3(b)(1), unless the authorization is  terminated or revoked. Performed at [facility], [address] [place], [place] [address]   SARS CORONAVIRUS 2 (TAT 6-24 HRS) Nasopharyngeal Nasopharyngeal Swab     Status: None   Collection Time: 09/17/19  4:31 PM   Specimen: Nasopharyngeal Swab  Result Value Ref Range Status   SARS Coronavirus 2 NEGATIVE NEGATIVE Final    Comment: (NOTE) SARS-CoV-2 target nucleic acids are NOT DETECTED. The SARS-CoV-2 RNA is generally detectable in upper and lower respiratory specimens during the acute phase of infection. Negative results do not preclude SARS-CoV-2 infection, do not rule out co-infections with other pathogens, and should not be used as the sole basis for treatment or other patient management decisions. Negative results must be combined with clinical observations, patient history, and epidemiological information. The expected result is Negative. Fact Sheet for Patients: HairSlick.nohttps://www.fda.gov/media/138098/download Fact Sheet for Healthcare Providers: quierodirigir.comhttps://www.fda.gov/media/138095/download This test is not yet approved or cleared by the [place] FDA and  has been authorized for detection and/or diagnosis of SARS-CoV-2 by FDA under an Emergency Use Authorization (EUA). This EUA will remain  in effect (meaning this test can be used) for the duration of the COVID-19 declaration under Section 56 4(b)(1) of the Act, 21 U.S.C. section 360bbb-3(b)(1),  unless the authorization is terminated or revoked sooner. Performed at [facility], [address] [place], [place] [address]      Labs: Basic Metabolic Panel: Recent Labs  Lab 09/17/19 0219 09/18/19 0515 09/19/19 0248 09/20/19 1122 09/21/19 0312  NA 139 140 137 139 137  K 4.3 4.9 5.1 5.5* 4.9  CL 106 108 105 106 106  CO2 21* 22 22 17* 19*  GLUCOSE 157* 124* 125* 120* 116*  BUN 25* 47* 64* 71* 69*  CREATININE 1.21* 1.64* 2.21* 2.00* 1.56*  CALCIUM 8.8* 8.7* 8.5* 8.8* 8.6*  MG  --  2.2  --   --   --    Liver Function Tests: No results for input(s): AST, ALT, ALKPHOS, BILITOT, PROT, ALBUMIN in the last 168 hours. No results for input(s): LIPASE, AMYLASE in the last 168 hours. No results for input(s): AMMONIA in the last 168 hours. CBC: Recent Labs  Lab 09/17/19 0219 09/18/19 0515 09/19/19 0248 09/21/19 0312  WBC 9.0 9.3 8.3 9.8  NEUTROABS 7.5 7.8* 6.9  --   HGB 12.5 10.9* 10.6* 12.0  HCT 39.4 33.9* 33.2* 36.4  MCV 96.8 96.0 96.5 93.3  PLT 255 219 208 260   Cardiac Enzymes: No results for input(s): CKTOTAL, CKMB, CKMBINDEX, TROPONINI in the last 168 hours. BNP: BNP (last 3 results) Recent Labs    09/17/19 0220 09/18/19 0515  BNP 19.4 2,214.6*    ProBNP (last 3 results) No results for input(s): PROBNP in the last 8760 hours.  CBG: No results for input(s): GLUCAP in the last 168 hours.  Time spent: 35 minutes were spent in preparing this discharge including medication reconciliation, counseling, and coordination of care.  Signed:  Willene Holian Vergie LivingMohammed Novalie Leamy, MD  [facility] 09/21/2019, 8:36 AM

## 2019-09-22 DIAGNOSIS — N179 Acute kidney failure, unspecified: Secondary | ICD-10-CM | POA: Diagnosis not present

## 2019-09-22 DIAGNOSIS — D649 Anemia, unspecified: Secondary | ICD-10-CM | POA: Diagnosis not present

## 2019-09-22 DIAGNOSIS — E039 Hypothyroidism, unspecified: Secondary | ICD-10-CM | POA: Diagnosis not present

## 2019-09-22 DIAGNOSIS — E785 Hyperlipidemia, unspecified: Secondary | ICD-10-CM | POA: Diagnosis not present

## 2019-09-22 DIAGNOSIS — J159 Unspecified bacterial pneumonia: Secondary | ICD-10-CM | POA: Diagnosis not present

## 2019-09-22 DIAGNOSIS — I110 Hypertensive heart disease with heart failure: Secondary | ICD-10-CM | POA: Diagnosis not present

## 2019-09-22 DIAGNOSIS — I5021 Acute systolic (congestive) heart failure: Secondary | ICD-10-CM | POA: Diagnosis not present

## 2019-09-22 DIAGNOSIS — E559 Vitamin D deficiency, unspecified: Secondary | ICD-10-CM | POA: Diagnosis not present

## 2019-09-22 DIAGNOSIS — R0600 Dyspnea, unspecified: Secondary | ICD-10-CM | POA: Diagnosis not present

## 2019-09-22 NOTE — Telephone Encounter (Signed)
09/22/2019  Ms. [name] informed to continue all medications for pt  Krystal Johns, CMA

## 2019-09-24 DIAGNOSIS — E785 Hyperlipidemia, unspecified: Secondary | ICD-10-CM | POA: Diagnosis not present

## 2019-09-24 DIAGNOSIS — I110 Hypertensive heart disease with heart failure: Secondary | ICD-10-CM | POA: Diagnosis not present

## 2019-09-24 DIAGNOSIS — J159 Unspecified bacterial pneumonia: Secondary | ICD-10-CM | POA: Diagnosis not present

## 2019-09-24 DIAGNOSIS — N179 Acute kidney failure, unspecified: Secondary | ICD-10-CM | POA: Diagnosis not present

## 2019-09-24 DIAGNOSIS — R0600 Dyspnea, unspecified: Secondary | ICD-10-CM | POA: Diagnosis not present

## 2019-09-24 DIAGNOSIS — I5021 Acute systolic (congestive) heart failure: Secondary | ICD-10-CM | POA: Diagnosis not present

## 2019-09-25 DIAGNOSIS — R0600 Dyspnea, unspecified: Secondary | ICD-10-CM | POA: Diagnosis not present

## 2019-09-25 DIAGNOSIS — J159 Unspecified bacterial pneumonia: Secondary | ICD-10-CM | POA: Diagnosis not present

## 2019-09-25 DIAGNOSIS — E785 Hyperlipidemia, unspecified: Secondary | ICD-10-CM | POA: Diagnosis not present

## 2019-09-25 DIAGNOSIS — N179 Acute kidney failure, unspecified: Secondary | ICD-10-CM | POA: Diagnosis not present

## 2019-09-25 DIAGNOSIS — I5021 Acute systolic (congestive) heart failure: Secondary | ICD-10-CM | POA: Diagnosis not present

## 2019-09-25 DIAGNOSIS — I110 Hypertensive heart disease with heart failure: Secondary | ICD-10-CM | POA: Diagnosis not present

## 2019-09-28 DIAGNOSIS — I5021 Acute systolic (congestive) heart failure: Secondary | ICD-10-CM | POA: Diagnosis not present

## 2019-09-28 DIAGNOSIS — E785 Hyperlipidemia, unspecified: Secondary | ICD-10-CM | POA: Diagnosis not present

## 2019-09-28 DIAGNOSIS — R0600 Dyspnea, unspecified: Secondary | ICD-10-CM | POA: Diagnosis not present

## 2019-09-28 DIAGNOSIS — N179 Acute kidney failure, unspecified: Secondary | ICD-10-CM | POA: Diagnosis not present

## 2019-09-28 DIAGNOSIS — J159 Unspecified bacterial pneumonia: Secondary | ICD-10-CM | POA: Diagnosis not present

## 2019-09-28 DIAGNOSIS — I110 Hypertensive heart disease with heart failure: Secondary | ICD-10-CM | POA: Diagnosis not present

## 2019-09-29 DIAGNOSIS — I5021 Acute systolic (congestive) heart failure: Secondary | ICD-10-CM | POA: Diagnosis not present

## 2019-09-29 DIAGNOSIS — N179 Acute kidney failure, unspecified: Secondary | ICD-10-CM | POA: Diagnosis not present

## 2019-09-29 DIAGNOSIS — E785 Hyperlipidemia, unspecified: Secondary | ICD-10-CM | POA: Diagnosis not present

## 2019-09-29 DIAGNOSIS — I110 Hypertensive heart disease with heart failure: Secondary | ICD-10-CM | POA: Diagnosis not present

## 2019-09-29 DIAGNOSIS — R0600 Dyspnea, unspecified: Secondary | ICD-10-CM | POA: Diagnosis not present

## 2019-09-29 DIAGNOSIS — J159 Unspecified bacterial pneumonia: Secondary | ICD-10-CM | POA: Diagnosis not present

## 2019-10-02 DIAGNOSIS — R0600 Dyspnea, unspecified: Secondary | ICD-10-CM | POA: Diagnosis not present

## 2019-10-02 DIAGNOSIS — J159 Unspecified bacterial pneumonia: Secondary | ICD-10-CM | POA: Diagnosis not present

## 2019-10-02 DIAGNOSIS — E785 Hyperlipidemia, unspecified: Secondary | ICD-10-CM | POA: Diagnosis not present

## 2019-10-02 DIAGNOSIS — I110 Hypertensive heart disease with heart failure: Secondary | ICD-10-CM | POA: Diagnosis not present

## 2019-10-02 DIAGNOSIS — I5021 Acute systolic (congestive) heart failure: Secondary | ICD-10-CM | POA: Diagnosis not present

## 2019-10-02 DIAGNOSIS — N179 Acute kidney failure, unspecified: Secondary | ICD-10-CM | POA: Diagnosis not present

## 2019-10-04 DIAGNOSIS — N179 Acute kidney failure, unspecified: Secondary | ICD-10-CM | POA: Diagnosis not present

## 2019-10-04 DIAGNOSIS — I5021 Acute systolic (congestive) heart failure: Secondary | ICD-10-CM | POA: Diagnosis not present

## 2019-10-04 DIAGNOSIS — R0600 Dyspnea, unspecified: Secondary | ICD-10-CM | POA: Diagnosis not present

## 2019-10-04 DIAGNOSIS — E785 Hyperlipidemia, unspecified: Secondary | ICD-10-CM | POA: Diagnosis not present

## 2019-10-04 DIAGNOSIS — I110 Hypertensive heart disease with heart failure: Secondary | ICD-10-CM | POA: Diagnosis not present

## 2019-10-04 DIAGNOSIS — J159 Unspecified bacterial pneumonia: Secondary | ICD-10-CM | POA: Diagnosis not present

## 2019-10-07 ENCOUNTER — Other Ambulatory Visit: Payer: Self-pay

## 2019-10-07 ENCOUNTER — Ambulatory Visit
Admission: EM | Admit: 2019-10-07 | Discharge: 2019-10-07 | Disposition: A | Discharge status: Home / Self Care | Attending: Physician Assistant | Admitting: Physician Assistant

## 2019-10-07 ENCOUNTER — Ambulatory Visit (INDEPENDENT_AMBULATORY_CARE_PROVIDER_SITE_OTHER)

## 2019-10-07 ENCOUNTER — Encounter: Payer: Self-pay | Admitting: Emergency Medicine

## 2019-10-07 ENCOUNTER — Telehealth: Payer: Self-pay | Admitting: Emergency Medicine

## 2019-10-07 DIAGNOSIS — R0602 Shortness of breath: Secondary | ICD-10-CM | POA: Diagnosis not present

## 2019-10-07 DIAGNOSIS — J90 Pleural effusion, not elsewhere classified: Secondary | ICD-10-CM | POA: Diagnosis not present

## 2019-10-07 DIAGNOSIS — Z880 Allergy status to penicillin: Secondary | ICD-10-CM | POA: Diagnosis not present

## 2019-10-07 DIAGNOSIS — Z79899 Other long term (current) drug therapy: Secondary | ICD-10-CM | POA: Diagnosis not present

## 2019-10-07 DIAGNOSIS — I509 Heart failure, unspecified: Secondary | ICD-10-CM | POA: Diagnosis not present

## 2019-10-07 DIAGNOSIS — E785 Hyperlipidemia, unspecified: Secondary | ICD-10-CM | POA: Diagnosis not present

## 2019-10-07 DIAGNOSIS — N179 Acute kidney failure, unspecified: Secondary | ICD-10-CM | POA: Diagnosis not present

## 2019-10-07 DIAGNOSIS — Z8249 Family history of ischemic heart disease and other diseases of the circulatory system: Secondary | ICD-10-CM | POA: Diagnosis not present

## 2019-10-07 DIAGNOSIS — I110 Hypertensive heart disease with heart failure: Secondary | ICD-10-CM | POA: Insufficient documentation

## 2019-10-07 DIAGNOSIS — J159 Unspecified bacterial pneumonia: Secondary | ICD-10-CM | POA: Diagnosis not present

## 2019-10-07 DIAGNOSIS — R062 Wheezing: Secondary | ICD-10-CM | POA: Diagnosis not present

## 2019-10-07 DIAGNOSIS — R0600 Dyspnea, unspecified: Secondary | ICD-10-CM | POA: Diagnosis not present

## 2019-10-07 DIAGNOSIS — Z87442 Personal history of urinary calculi: Secondary | ICD-10-CM | POA: Diagnosis not present

## 2019-10-07 DIAGNOSIS — I5021 Acute systolic (congestive) heart failure: Secondary | ICD-10-CM | POA: Diagnosis not present

## 2019-10-07 LAB — CBC WITH DIFFERENTIAL/PLATELET
Basophils Absolute: 0 10*3/uL (ref 0.0–0.2)
Basos: 0 %
EOS (ABSOLUTE): 0 10*3/uL (ref 0.0–0.4)
Eos: 0 %
Hematocrit: 35.9 % (ref 34.0–46.6)
Hemoglobin: 12 g/dL (ref 11.1–15.9)
Immature Grans (Abs): 0 10*3/uL (ref 0.0–0.1)
Immature Granulocytes: 0 %
Lymphocytes Absolute: 0.6 10*3/uL — ABNORMAL LOW (ref 0.7–3.1)
Lymphs: 8 %
MCH: 31.1 pg (ref 26.6–33.0)
MCHC: 33.4 g/dL (ref 31.5–35.7)
MCV: 93 fL (ref 79–97)
Monocytes Absolute: 0.5 10*3/uL (ref 0.1–0.9)
Monocytes: 6 %
Neutrophils Absolute: 6.2 10*3/uL (ref 1.4–7.0)
Neutrophils: 86 %
Platelets: 153 10*3/uL (ref 150–450)
RBC: 3.86 x10E6/uL (ref 3.77–5.28)
RDW: 12.8 % (ref 11.7–15.4)
WBC: 7.4 10*3/uL (ref 3.4–10.8)

## 2019-10-07 LAB — COMPREHENSIVE METABOLIC PANEL
ALT: 36 IU/L — ABNORMAL HIGH (ref 0–32)
AST: 21 IU/L (ref 0–40)
Albumin/Globulin Ratio: 1.4 (ref 1.2–2.2)
Albumin: 3.4 g/dL — ABNORMAL LOW (ref 3.5–4.6)
Alkaline Phosphatase: 112 IU/L (ref 39–117)
BUN/Creatinine Ratio: 24 (ref 12–28)
BUN: 28 mg/dL (ref 10–36)
Bilirubin Total: 0.4 mg/dL (ref 0.0–1.2)
CO2: 23 mmol/L (ref 20–29)
Calcium: 8.6 mg/dL — ABNORMAL LOW (ref 8.7–10.3)
Chloride: 96 mmol/L (ref 96–106)
Creatinine, Ser: 1.19 mg/dL — ABNORMAL HIGH (ref 0.57–1.00)
GFR calc Af Amer: 45 mL/min/{1.73_m2} — ABNORMAL LOW (ref >59)
GFR calc non Af Amer: 39 mL/min/{1.73_m2} — ABNORMAL LOW (ref >59)
Globulin, Total: 2.5 g/dL (ref 1.5–4.5)
Glucose: 133 mg/dL — ABNORMAL HIGH (ref 65–99)
Potassium: 3.7 mmol/L (ref 3.5–5.2)
Sodium: 141 mmol/L (ref 134–144)
Total Protein: 5.9 g/dL — ABNORMAL LOW (ref 6.0–8.5)

## 2019-10-07 LAB — POCT URINALYSIS DIP (MANUAL ENTRY)
Bilirubin, UA: NEGATIVE
Glucose, UA: NEGATIVE mg/dL
Ketones, POC UA: NEGATIVE mg/dL
Nitrite, UA: NEGATIVE
Protein Ur, POC: NEGATIVE mg/dL
Spec Grav, UA: 1.02 (ref 1.010–1.025)
Urobilinogen, UA: 0.2 E.U./dL
pH, UA: 5 (ref 5.0–8.0)

## 2019-10-07 MED ORDER — ALBUTEROL SULFATE HFA 108 (90 BASE) MCG/ACT IN AERS: 1.0000 | INHALATION_SPRAY | Freq: Four times a day (QID) | RESPIRATORY_TRACT | 0 refills | Status: AC | PRN

## 2019-10-07 NOTE — ED Provider Notes (Signed)
[facility]    CSN: [id number] Arrival date & time: 10/07/19  0900      History   Chief Complaint Chief Complaint  Patient presents with  . Shortness of Breath    HPI Krystal Johns is a 86 y.o. female.   86 year old female with history of HTN, hypothyroidism, comes in with family members for increased shortness of breath after being discharged 09/21/2019.  HPI obtained by family member.  At the time, she was found to be hypoxic at 88 with bilateral wheezing and CXR showing bilateral infiltrates and pleural effusion suspicious for RLL pneumonia. She was also found to have new onset CHF. She received IV zithromax and rocephen and solumedrol with improvement of symptoms, but required 2L O2 at discharge. She was discharged with azithromycin, prednisone, lasix 40mg  daily.  Family member states since discharge, patient had been improving on symptoms, with more energy, and being active.  Cough continued, without worsening.  She was seen by CHF nurse 10/02/2019, who discontinued daily Lasix, and switch to Lasix as needed.  Since then, family member has felt that patient has decreased energy, and in the past 2 days, has had worsening of shortness of breath.  Patient still able to ambulate on own as baseline, but has increased work of breath after exertion.  Patient has denied chest pain.  Has continued 2 L of oxygen without changes.  Denies fever, chills, body aches.  Denies significant weakness, dizziness, passing out.  Since as needed Lasix, has taken 3 doses of Lasix with mild relief of symptoms.  EMS was called today due to increased work of breathing, who found patient to be wheezing in all fields, and was provided DuoNeb treatments.  Since treatment, has noted residual right-sided wheezing, and patient was brought to urgent care for further evaluation.     Past Medical History:  Diagnosis Date  . Asymptomatic PVCs   . Diverticulitis of colon   . Hypertension   . Osteoporosis   .  PAC (premature atrial contraction)    asymptomatic  . Thyroid disease   . Vitamin D deficiency     Patient Active Problem List   Diagnosis Date Noted  . AKI (acute kidney injury) (HCC)   . Goals of care, counseling/discussion   . Palliative care by specialist   . DNR (do not resuscitate)   . CAP (community acquired pneumonia) 09/17/2019  . Pneumonia 09/17/2019  . Hypoxia   . Encounter for Medicare annual wellness exam 12/07/2018  . Altered mental status 07/21/2018  . Abnormal urinalysis 07/21/2018  . Elevated LDL cholesterol level 05/26/2018  . Healthcare maintenance 05/26/2017  . Olecranon fracture, right, closed, initial encounter 11/09/2016  . Systolic murmur 11/22/2014  . Hypothyroidism 11/22/2014  . Hypertension 02/14/2011  . Osteoporosis 02/14/2011  . Vitamin D deficiency 02/14/2011    Past Surgical History:  Procedure Laterality Date  . STONE EXTRACTION WITH BASKET      OB History   No obstetric history on file.      Home Medications    Prior to Admission medications   Medication Sig Start Date End Date Taking? Authorizing Provider  albuterol (VENTOLIN HFA) 108 (90 Base) MCG/ACT inhaler Inhale 1-2 puffs into the lungs every 6 (six) hours as needed for wheezing or shortness of breath. 10/07/19   10/09/19, Antionetta Ator V, PA-C  furosemide (LASIX) 40 MG tablet Take 1 tablet (40 mg total) by mouth daily for 14 days. Patient taking differently: Take 40 mg by mouth daily. Pt taking as  needed 09/21/19 10/05/19  Colon Branch, MD    Family History Family History  Problem Relation Age of Onset  . Heart disease Mother   . Hypertension Mother   . Heart disease Father   . Cancer Brother     Social History Social History   Tobacco Use  . Smoking status: Never Smoker  . Smokeless tobacco: Never Used  Substance Use Topics  . Alcohol use: No  . Drug use: No     Allergies   Penicillins   Review of Systems Review of Systems  Reason unable to perform ROS: See  HPI as above.     Physical Exam Triage Vital Signs ED Triage Vitals [10/07/19 0916]  Enc Vitals Group     BP 115/74     Pulse Rate 97     Resp 20     Temp 97.6 F (36.4 C)     Temp Source Oral     SpO2 91 %     Weight      Height      Head Circumference      Peak Flow      Pain Score 0     Pain Loc      Pain Edu?      Excl. in GC?    No data found.  Updated Vital Signs BP 115/74 (BP Location: Right Arm)   Pulse 97   Temp 97.6 F (36.4 C) (Oral)   Resp 20   Wt 109 lb (49.4 kg)   SpO2 95%   BMI 19.31 kg/m   Physical Exam Constitutional:      General: She is not in acute distress.    Appearance: Normal appearance. She is not toxic-appearing or diaphoretic.     Comments: Sitting in wheelchair comfortably.   HENT:     Head: Normocephalic and atraumatic.  Eyes:     Conjunctiva/sclera: Conjunctivae normal.     Pupils: Pupils are equal, round, and reactive to light.  Cardiovascular:     Rate and Rhythm: Normal rate and regular rhythm.  Pulmonary:     Effort: Pulmonary effort is normal. No respiratory distress.     Comments: Speaking in full sentences without difficulty. Bilateral crackles to lower fields, R>L. No wheezing. Musculoskeletal:     Cervical back: Normal range of motion and neck supple.     Comments: 1+ pitting edema to bilateral knee. No erythema, warmth. No tenderness to palpation  Skin:    General: Skin is warm and dry.  Neurological:     Mental Status: She is alert and oriented to person, place, and time.      UC Treatments / Results  Labs (all labs ordered are listed, but only abnormal results are displayed) Labs Reviewed  CBC WITH DIFFERENTIAL/PLATELET - Abnormal; Notable for the following components:      Result Value   Lymphocytes Absolute 0.6 (*)    All other components within normal limits   Narrative:    Performed at:  [address], [place], [place]  [id number] Lab Director: Jolene Schimke MD, Phone:   [phone]  COMPREHENSIVE METABOLIC PANEL - Abnormal; Notable for the following components:   Glucose 133 (*)    Creatinine, Ser 1.19 (*)    GFR calc non Af Amer 39 (*)    GFR calc Af Amer 45 (*)    Calcium 8.6 (*)    Total Protein 5.9 (*)    Albumin 3.4 (*)    ALT 36 (*)  All other components within normal limits   Narrative:    Performed at:  [address], [place], [place]  [id number] Lab Director: Jolene Schimke MD, Phone:  [phone]  POCT URINALYSIS DIP (MANUAL ENTRY) - Abnormal; Notable for the following components:   Blood, UA trace-lysed (*)    Leukocytes, UA Small (1+) (*)    All other components within normal limits  URINE CULTURE    EKG   Radiology DG Chest 2 View  Result Date: 10/07/2019 CLINICAL DATA:  86 year old female with history of pneumonia. EXAM: CHEST - 2 VIEW COMPARISON:  Chest x-ray 09/17/2019. FINDINGS: Lung volumes are low. Bibasilar opacities which may reflect areas of atelectasis and/or consolidation with moderate bilateral pleural effusions. Cephalization of the pulmonary vasculature, without frank pulmonary edema. Cardiac silhouette is obscured. Upper mediastinal contours are within normal limits. Aortic atherosclerosis. IMPRESSION: 1. Moderate bilateral pleural effusions with bibasilar areas of atelectasis and/or consolidation. 2. Cephalization of the pulmonary vasculature. 3. Aortic atherosclerosis. Electronically Signed   By: Trudie Reed M.D.   On: 10/07/2019 10:34    Procedures Procedures (including critical care time)  Medications Ordered in UC Medications - No data to display  Initial Impression / Assessment and Plan / UC Course  I have reviewed the triage vital signs and the nursing notes.  Pertinent labs & imaging results that were available during my care of the patient were reviewed by me and considered in my medical decision making (see chart for details).    Discussed case with Dr Leonides Grills. 86 year old  female with history of HTN, hypothyroidism comes in with family members for increased shortness of breath after being discharged 09/21/2019. Patient was being treated for right lower lobe pneumonia with new onset CHF. She was discharged with 2 L O2, azithromycin, prednisone, Lasix 40 mg daily. She finished azithromycin and prednisone as directed, and continued Lasix as directed with good relief. On 10/02/2019, Lasix was switched to 40 mg as needed due to changes in creatinine. Since then, family members have noticed patient with increased work of breathing, and decreased energy. No fevers, chest pain. No significant weakness, dizziness, syncope.  EMS was called today due to increased work of breathing, and found patient to be wheezing in all fields. She was provided DuoNeb treatments with good relief, and EMS noted residual right-sided wheezing, and therefore brought to urgent care for evaluation.  On exam, patient is in wheelchair, sitting comfortably without acute distress. She is nontoxic in appearance. Vitals stable with O2 at 95% on 2L O2. RRR. Lungs with bilateral lower lobe crackles, no wheezing. Bilateral 1+ pitting edema to the knees.  Chest x-ray shows moderate bilateral pleural effusion with bibasilar areas of atelectasis and/or consolidation. Cephalization of the pulmonary vasculature. Aortic atherosclerosis.  Urine dipstick with trace blood and small leuks.   Patient was 115lb during hospital stay, where family member states normal/baseline closer to 107lb. She is weighed 109lb today. Will switch lasix to 20mg  daily, with extra 20mg  dosage if weight >3lb of normal if needed. Will provide albuterol as needed given recent wheezing. Will repeat CBC/CMP for evaluation and await urine culture. Return precautions given. Patient and family member expresses understanding and agrees to plan. Patient discharged in stable condition pending lab results.  CBC, CMP without alarming values. Creatinine  improved since discharge. At this time, will continue current plan, and await urine culture. Patient to follow-up with PCP this upcoming week for reevaluation. Return precautions given. Lab value and plan called into patient/family  member by RN, please see note.  Case discussed with Dr Lanny Cramp, who agrees to plan.  Final Clinical Impressions(s) / UC Diagnoses   Final diagnoses:  Acute congestive heart failure, unspecified heart failure type [facility])   ED Prescriptions    Medication Sig Dispense Auth. Provider   albuterol (VENTOLIN HFA) 108 (90 Base) MCG/ACT inhaler Inhale 1-2 puffs into the lungs every 6 (six) hours as needed for wheezing or shortness of breath. 8 g Ok Edwards, PA-C     PDMP not reviewed this encounter.   Ok Edwards, PA-C 10/08/19 [phone]

## 2019-10-07 NOTE — Telephone Encounter (Signed)
Spoke with sister; informed about labs and same plan of care; verbalized understanding

## 2019-10-07 NOTE — ED Triage Notes (Signed)
Pt here after being SOB last night; EMS was called and gave 10/.5 duoneb with results from wheezing all fields to clear except right side; pt recently hospitalized with PNA; pt on home O2 with 24 hour care

## 2019-10-07 NOTE — Discharge Instructions (Addendum)
CXR shows fluid in lungs, though since weight has not changed, less likely worsening. At this time, change lasix to 20mg  daily. Weigh today at home with scale and document number. If more than 3 lbs of normal (107lb), can take extra 20mg  dose of lasix. If less than 3lb of normal, can hold off one day of lasix. Albuterol as needed to see if this helps with breathing. Follow up with PCP next week for reevaluation. If worsening, go to the emergency department for further evaluation. Otherwise, we will let you know the result of the labs.

## 2019-10-08 ENCOUNTER — Telehealth: Payer: Self-pay | Admitting: Physician Assistant

## 2019-10-08 DIAGNOSIS — I5021 Acute systolic (congestive) heart failure: Secondary | ICD-10-CM | POA: Diagnosis not present

## 2019-10-08 DIAGNOSIS — I110 Hypertensive heart disease with heart failure: Secondary | ICD-10-CM | POA: Diagnosis not present

## 2019-10-08 DIAGNOSIS — E785 Hyperlipidemia, unspecified: Secondary | ICD-10-CM | POA: Diagnosis not present

## 2019-10-08 DIAGNOSIS — N179 Acute kidney failure, unspecified: Secondary | ICD-10-CM | POA: Diagnosis not present

## 2019-10-08 DIAGNOSIS — R0600 Dyspnea, unspecified: Secondary | ICD-10-CM | POA: Diagnosis not present

## 2019-10-08 DIAGNOSIS — J159 Unspecified bacterial pneumonia: Secondary | ICD-10-CM | POA: Diagnosis not present

## 2019-10-08 LAB — URINE CULTURE: Culture: NO GROWTH

## 2019-10-08 NOTE — Telephone Encounter (Signed)
Informed sister, jeanette, with permission of patient. Urine culture negative. Patient still with intermittent episodes of shortness of breath, though without worsening. Weight stable at 107lb. Recently switched from nasal canula to mask per caregiver to see if breathing can improve. Trying to obtain pulse ox machine to monitor O2. Given labs with urine culture without alarming signs, will continue to monitor and have patient follow up with PCP for further evaluation needed. Return precautions given. Sister expresses understanding and agrees to plan.

## 2019-10-09 DIAGNOSIS — N179 Acute kidney failure, unspecified: Secondary | ICD-10-CM | POA: Diagnosis not present

## 2019-10-09 DIAGNOSIS — E785 Hyperlipidemia, unspecified: Secondary | ICD-10-CM | POA: Diagnosis not present

## 2019-10-09 DIAGNOSIS — I5021 Acute systolic (congestive) heart failure: Secondary | ICD-10-CM | POA: Diagnosis not present

## 2019-10-09 DIAGNOSIS — I110 Hypertensive heart disease with heart failure: Secondary | ICD-10-CM | POA: Diagnosis not present

## 2019-10-09 DIAGNOSIS — R0600 Dyspnea, unspecified: Secondary | ICD-10-CM | POA: Diagnosis not present

## 2019-10-09 DIAGNOSIS — J159 Unspecified bacterial pneumonia: Secondary | ICD-10-CM | POA: Diagnosis not present

## 2019-10-10 ENCOUNTER — Telehealth: Payer: Self-pay | Admitting: Adult Health

## 2019-10-10 DIAGNOSIS — R0600 Dyspnea, unspecified: Secondary | ICD-10-CM | POA: Diagnosis not present

## 2019-10-10 DIAGNOSIS — I110 Hypertensive heart disease with heart failure: Secondary | ICD-10-CM | POA: Diagnosis not present

## 2019-10-10 DIAGNOSIS — E785 Hyperlipidemia, unspecified: Secondary | ICD-10-CM | POA: Diagnosis not present

## 2019-10-10 DIAGNOSIS — J159 Unspecified bacterial pneumonia: Secondary | ICD-10-CM | POA: Diagnosis not present

## 2019-10-10 DIAGNOSIS — I5021 Acute systolic (congestive) heart failure: Secondary | ICD-10-CM | POA: Diagnosis not present

## 2019-10-10 DIAGNOSIS — N179 Acute kidney failure, unspecified: Secondary | ICD-10-CM | POA: Diagnosis not present

## 2019-10-10 NOTE — Telephone Encounter (Signed)
Patient's sister/caretaker (DPR on file) has some medical questions she would like to ask the clinic staff when they are available. Please contact jeanette at home number

## 2019-10-10 NOTE — Telephone Encounter (Signed)
Patient sister Lattie Corns calling to ask if there were any dx in patients chart concerning heart issues. Lattie Corns stating that patient heart only functioning at 20% and hospice is involved. Patient sister would like to speak with provider regarding what to do for patient.   I advised Lattie Corns that we could do a Telehealth apt with her an Bethanee regarding follow up from ED and care plan. Call transferred to front desk for scheduling. AS, CMA

## 2019-10-12 DIAGNOSIS — I5021 Acute systolic (congestive) heart failure: Secondary | ICD-10-CM | POA: Diagnosis not present

## 2019-10-12 DIAGNOSIS — N179 Acute kidney failure, unspecified: Secondary | ICD-10-CM | POA: Diagnosis not present

## 2019-10-12 DIAGNOSIS — I110 Hypertensive heart disease with heart failure: Secondary | ICD-10-CM | POA: Diagnosis not present

## 2019-10-12 DIAGNOSIS — R0600 Dyspnea, unspecified: Secondary | ICD-10-CM | POA: Diagnosis not present

## 2019-10-12 DIAGNOSIS — J159 Unspecified bacterial pneumonia: Secondary | ICD-10-CM | POA: Diagnosis not present

## 2019-10-12 DIAGNOSIS — E785 Hyperlipidemia, unspecified: Secondary | ICD-10-CM | POA: Diagnosis not present

## 2019-10-13 ENCOUNTER — Telehealth: Payer: Self-pay | Admitting: Adult Health

## 2019-10-13 DIAGNOSIS — N179 Acute kidney failure, unspecified: Secondary | ICD-10-CM | POA: Diagnosis not present

## 2019-10-13 DIAGNOSIS — I5021 Acute systolic (congestive) heart failure: Secondary | ICD-10-CM | POA: Diagnosis not present

## 2019-10-13 DIAGNOSIS — R0600 Dyspnea, unspecified: Secondary | ICD-10-CM | POA: Diagnosis not present

## 2019-10-13 DIAGNOSIS — J159 Unspecified bacterial pneumonia: Secondary | ICD-10-CM | POA: Diagnosis not present

## 2019-10-13 DIAGNOSIS — I110 Hypertensive heart disease with heart failure: Secondary | ICD-10-CM | POA: Diagnosis not present

## 2019-10-13 DIAGNOSIS — E785 Hyperlipidemia, unspecified: Secondary | ICD-10-CM | POA: Diagnosis not present

## 2019-10-13 NOTE — Telephone Encounter (Signed)
[facility] [phone] left msg for Shavonne to resubmit documents needing provider signature for Pt's comfort care.  --glh

## 2019-10-14 DIAGNOSIS — J159 Unspecified bacterial pneumonia: Secondary | ICD-10-CM | POA: Diagnosis not present

## 2019-10-14 DIAGNOSIS — R0600 Dyspnea, unspecified: Secondary | ICD-10-CM | POA: Diagnosis not present

## 2019-10-14 DIAGNOSIS — I110 Hypertensive heart disease with heart failure: Secondary | ICD-10-CM | POA: Diagnosis not present

## 2019-10-14 DIAGNOSIS — E785 Hyperlipidemia, unspecified: Secondary | ICD-10-CM | POA: Diagnosis not present

## 2019-10-14 DIAGNOSIS — I5021 Acute systolic (congestive) heart failure: Secondary | ICD-10-CM | POA: Diagnosis not present

## 2019-10-14 DIAGNOSIS — N179 Acute kidney failure, unspecified: Secondary | ICD-10-CM | POA: Diagnosis not present

## 2019-10-16 DIAGNOSIS — I5021 Acute systolic (congestive) heart failure: Secondary | ICD-10-CM | POA: Diagnosis not present

## 2019-10-16 DIAGNOSIS — E039 Hypothyroidism, unspecified: Secondary | ICD-10-CM | POA: Diagnosis not present

## 2019-10-16 DIAGNOSIS — N179 Acute kidney failure, unspecified: Secondary | ICD-10-CM | POA: Diagnosis not present

## 2019-10-16 DIAGNOSIS — I110 Hypertensive heart disease with heart failure: Secondary | ICD-10-CM | POA: Diagnosis not present

## 2019-10-16 DIAGNOSIS — E785 Hyperlipidemia, unspecified: Secondary | ICD-10-CM | POA: Diagnosis not present

## 2019-10-16 DIAGNOSIS — J159 Unspecified bacterial pneumonia: Secondary | ICD-10-CM | POA: Diagnosis not present

## 2019-10-16 DIAGNOSIS — R0600 Dyspnea, unspecified: Secondary | ICD-10-CM | POA: Diagnosis not present

## 2019-10-16 DIAGNOSIS — E559 Vitamin D deficiency, unspecified: Secondary | ICD-10-CM | POA: Diagnosis not present

## 2019-10-16 DIAGNOSIS — D649 Anemia, unspecified: Secondary | ICD-10-CM | POA: Diagnosis not present

## 2019-10-16 NOTE — Telephone Encounter (Signed)
Noted. AS, CMA 

## 2019-10-19 DIAGNOSIS — I5021 Acute systolic (congestive) heart failure: Secondary | ICD-10-CM | POA: Diagnosis not present

## 2019-10-19 DIAGNOSIS — N179 Acute kidney failure, unspecified: Secondary | ICD-10-CM | POA: Diagnosis not present

## 2019-10-19 DIAGNOSIS — I110 Hypertensive heart disease with heart failure: Secondary | ICD-10-CM | POA: Diagnosis not present

## 2019-10-19 DIAGNOSIS — J159 Unspecified bacterial pneumonia: Secondary | ICD-10-CM | POA: Diagnosis not present

## 2019-10-19 DIAGNOSIS — R0600 Dyspnea, unspecified: Secondary | ICD-10-CM | POA: Diagnosis not present

## 2019-10-19 DIAGNOSIS — E785 Hyperlipidemia, unspecified: Secondary | ICD-10-CM | POA: Diagnosis not present

## 2019-10-23 DIAGNOSIS — E785 Hyperlipidemia, unspecified: Secondary | ICD-10-CM | POA: Diagnosis not present

## 2019-10-23 DIAGNOSIS — N179 Acute kidney failure, unspecified: Secondary | ICD-10-CM | POA: Diagnosis not present

## 2019-10-23 DIAGNOSIS — J159 Unspecified bacterial pneumonia: Secondary | ICD-10-CM | POA: Diagnosis not present

## 2019-10-23 DIAGNOSIS — I5021 Acute systolic (congestive) heart failure: Secondary | ICD-10-CM | POA: Diagnosis not present

## 2019-10-23 DIAGNOSIS — R0600 Dyspnea, unspecified: Secondary | ICD-10-CM | POA: Diagnosis not present

## 2019-10-23 DIAGNOSIS — I110 Hypertensive heart disease with heart failure: Secondary | ICD-10-CM | POA: Diagnosis not present

## 2019-10-27 DIAGNOSIS — E785 Hyperlipidemia, unspecified: Secondary | ICD-10-CM | POA: Diagnosis not present

## 2019-10-27 DIAGNOSIS — R0600 Dyspnea, unspecified: Secondary | ICD-10-CM | POA: Diagnosis not present

## 2019-10-27 DIAGNOSIS — J159 Unspecified bacterial pneumonia: Secondary | ICD-10-CM | POA: Diagnosis not present

## 2019-10-27 DIAGNOSIS — I110 Hypertensive heart disease with heart failure: Secondary | ICD-10-CM | POA: Diagnosis not present

## 2019-10-27 DIAGNOSIS — N179 Acute kidney failure, unspecified: Secondary | ICD-10-CM | POA: Diagnosis not present

## 2019-10-27 DIAGNOSIS — I5021 Acute systolic (congestive) heart failure: Secondary | ICD-10-CM | POA: Diagnosis not present

## 2019-10-30 DIAGNOSIS — I5021 Acute systolic (congestive) heart failure: Secondary | ICD-10-CM | POA: Diagnosis not present

## 2019-10-30 DIAGNOSIS — N179 Acute kidney failure, unspecified: Secondary | ICD-10-CM | POA: Diagnosis not present

## 2019-10-30 DIAGNOSIS — E785 Hyperlipidemia, unspecified: Secondary | ICD-10-CM | POA: Diagnosis not present

## 2019-10-30 DIAGNOSIS — R0600 Dyspnea, unspecified: Secondary | ICD-10-CM | POA: Diagnosis not present

## 2019-10-30 DIAGNOSIS — I110 Hypertensive heart disease with heart failure: Secondary | ICD-10-CM | POA: Diagnosis not present

## 2019-10-30 DIAGNOSIS — J159 Unspecified bacterial pneumonia: Secondary | ICD-10-CM | POA: Diagnosis not present

## 2019-11-01 DIAGNOSIS — I110 Hypertensive heart disease with heart failure: Secondary | ICD-10-CM | POA: Diagnosis not present

## 2019-11-01 DIAGNOSIS — J159 Unspecified bacterial pneumonia: Secondary | ICD-10-CM | POA: Diagnosis not present

## 2019-11-01 DIAGNOSIS — N179 Acute kidney failure, unspecified: Secondary | ICD-10-CM | POA: Diagnosis not present

## 2019-11-01 DIAGNOSIS — I5021 Acute systolic (congestive) heart failure: Secondary | ICD-10-CM | POA: Diagnosis not present

## 2019-11-01 DIAGNOSIS — R0600 Dyspnea, unspecified: Secondary | ICD-10-CM | POA: Diagnosis not present

## 2019-11-01 DIAGNOSIS — E785 Hyperlipidemia, unspecified: Secondary | ICD-10-CM | POA: Diagnosis not present

## 2019-11-04 DIAGNOSIS — N179 Acute kidney failure, unspecified: Secondary | ICD-10-CM | POA: Diagnosis not present

## 2019-11-04 DIAGNOSIS — I110 Hypertensive heart disease with heart failure: Secondary | ICD-10-CM | POA: Diagnosis not present

## 2019-11-04 DIAGNOSIS — I5021 Acute systolic (congestive) heart failure: Secondary | ICD-10-CM | POA: Diagnosis not present

## 2019-11-04 DIAGNOSIS — J159 Unspecified bacterial pneumonia: Secondary | ICD-10-CM | POA: Diagnosis not present

## 2019-11-04 DIAGNOSIS — R0600 Dyspnea, unspecified: Secondary | ICD-10-CM | POA: Diagnosis not present

## 2019-11-04 DIAGNOSIS — E785 Hyperlipidemia, unspecified: Secondary | ICD-10-CM | POA: Diagnosis not present

## 2019-11-06 ENCOUNTER — Ambulatory Visit: Payer: Medicare Other | Admitting: Family Medicine

## 2019-11-06 ENCOUNTER — Other Ambulatory Visit: Payer: Self-pay

## 2019-11-16 DEATH — deceased

## 2021-10-11 IMAGING — DX DG CHEST 1V PORT
1 series · 1 of 1 positions shown · non-contrast
Comparison: 12/06/2014

CLINICAL DATA: Hypoxia and shortness of breath.

EXAM:
PORTABLE CHEST 1 VIEW

[chest]
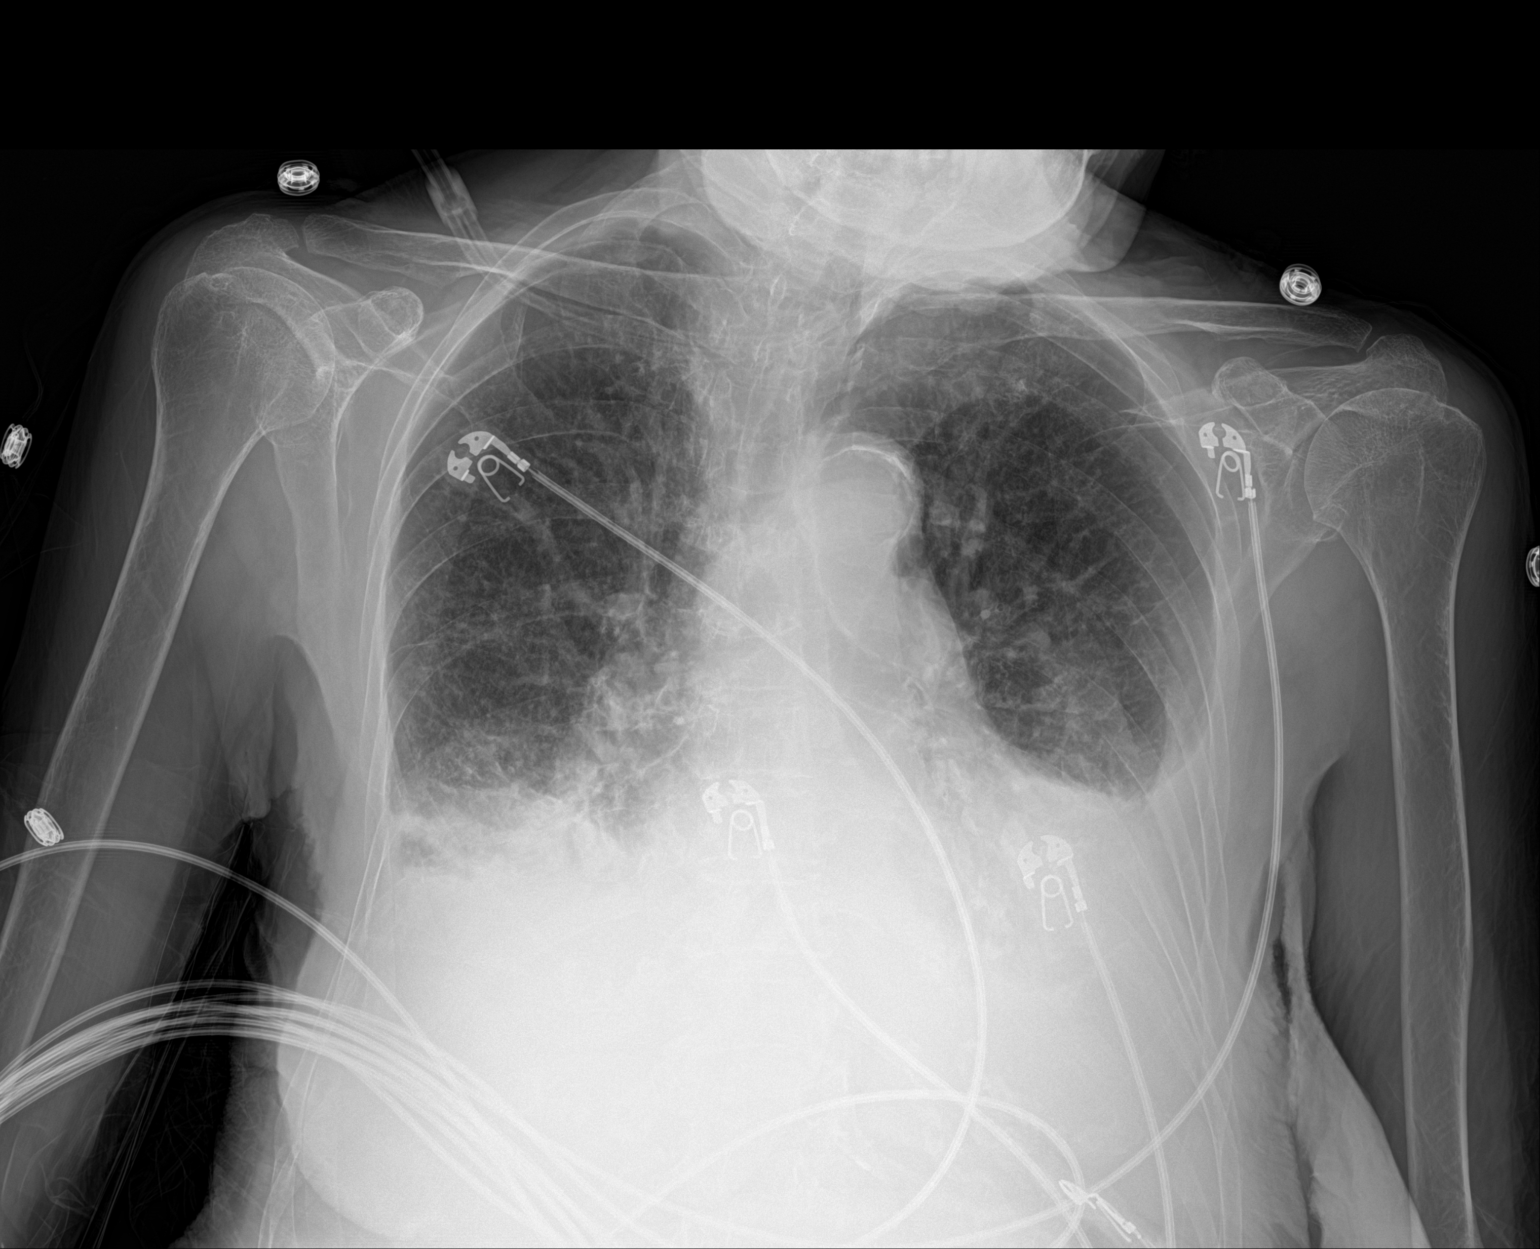

[1 of 1 positions shown; findings below may reference images not displayed]

FINDINGS: Low lung volumes. Bilateral pleural effusions, left greater than
right with associated bibasilar opacities. Upper normal heart size,
partially obscured by pleural fluid. Aortic atherosclerosis. Mild
vascular congestion possible mild septal thickening/pulmonary edema.
No pneumothorax. Bones are under mineralized.
IMPRESSION: 1. Bilateral pleural effusions, left greater than right, with
associated bibasilar opacities, likely atelectasis, however
pneumonia is also is considered particularly at the right lung base.
2. Mild vascular congestion and possible mild pulmonary edema.

Aortic Atherosclerosis ([id number]).

## 2021-10-13 IMAGING — US US RENAL
1 series · 14 of 25 positions shown · non-contrast
Comparison: None.

CLINICAL DATA: Acute kidney injury

EXAM:
RENAL / URINARY TRACT ULTRASOUND COMPLETE

[Series 1: us renal · 14 of 31 slices shown]
[im 1/31]
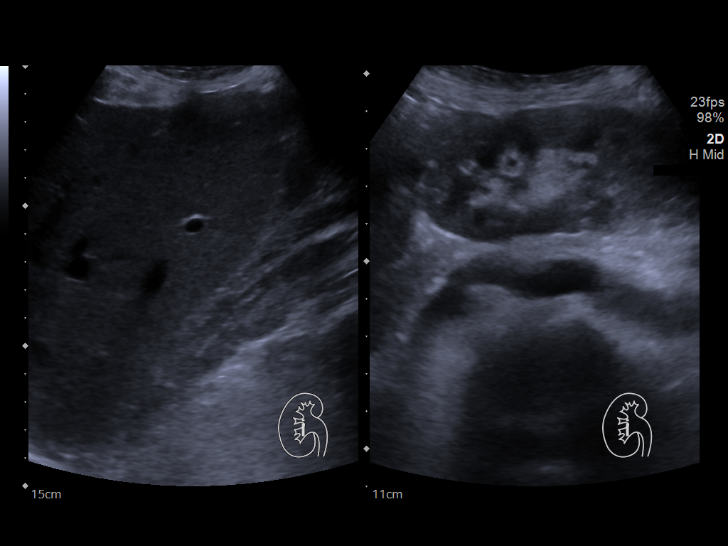
[im 3/31]
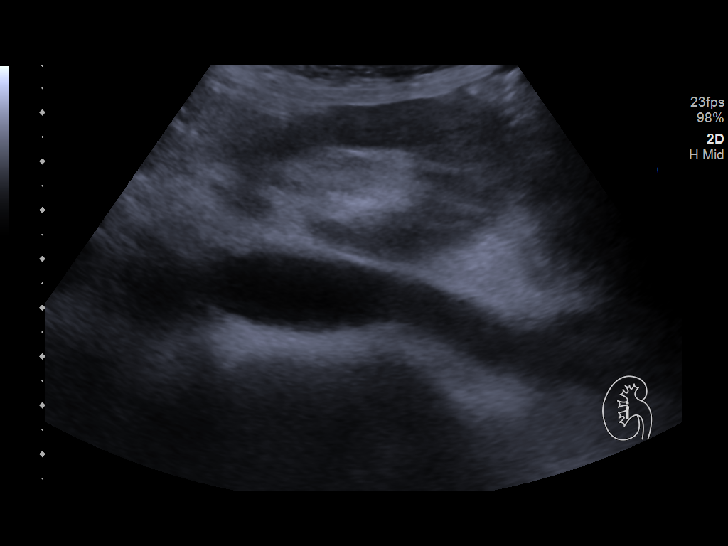
[im 6/31]
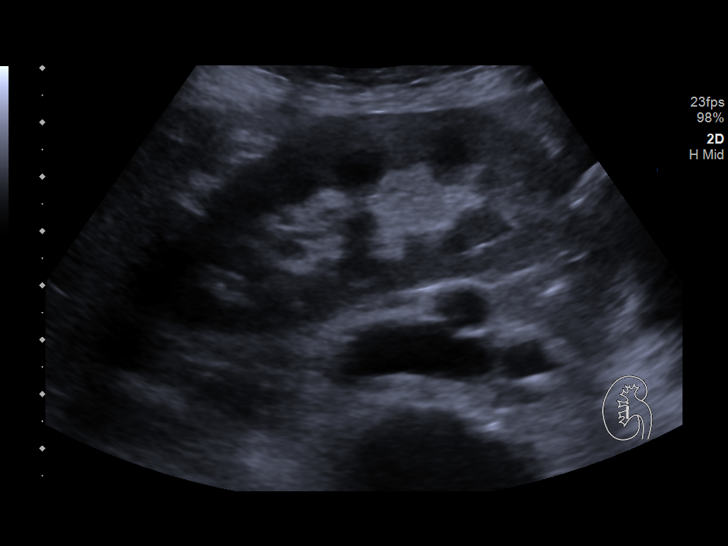
[im 8/31]
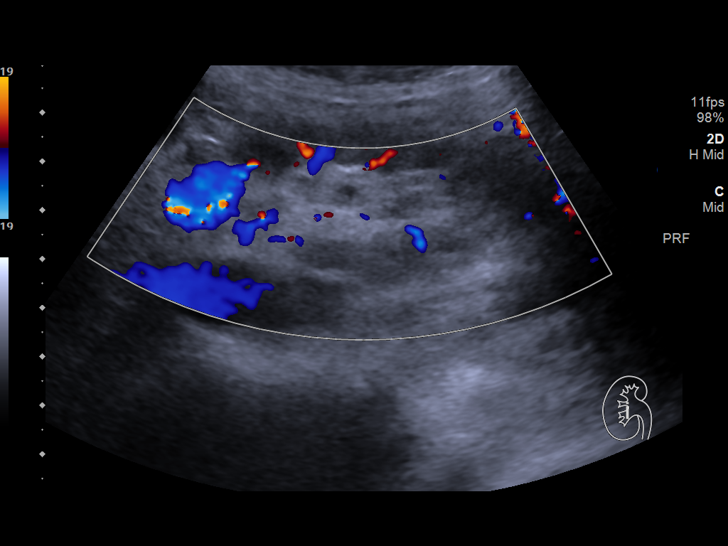
[im 11/31]
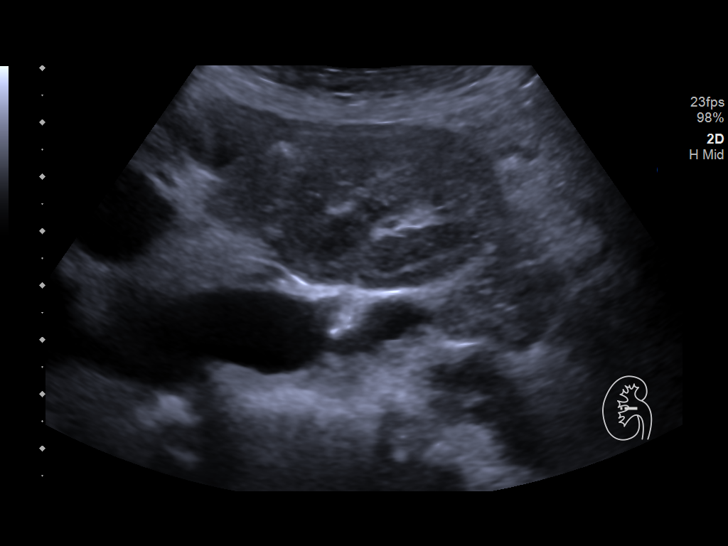
[im 12/31]
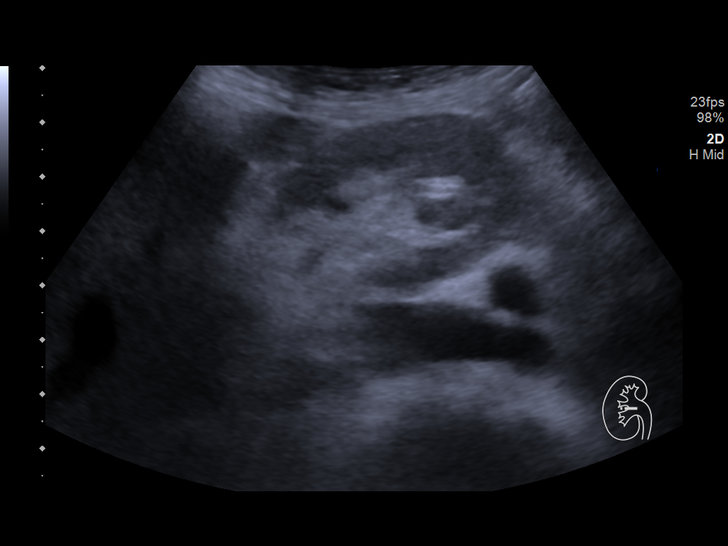
[im 14/31]
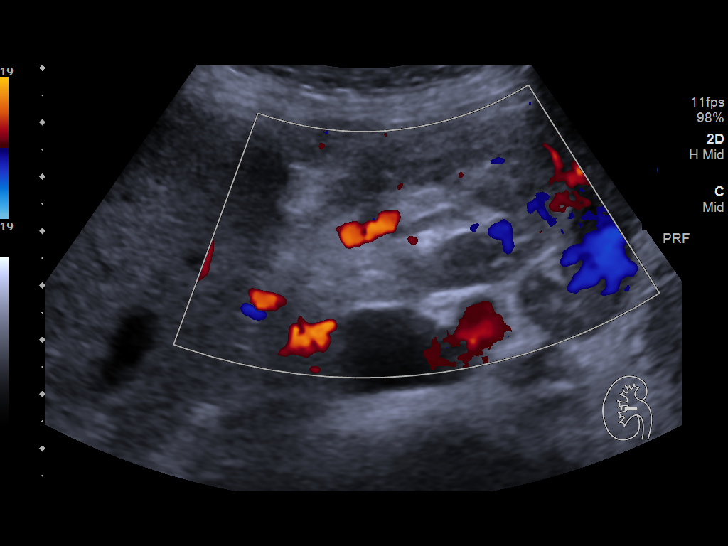
[im 17/31]
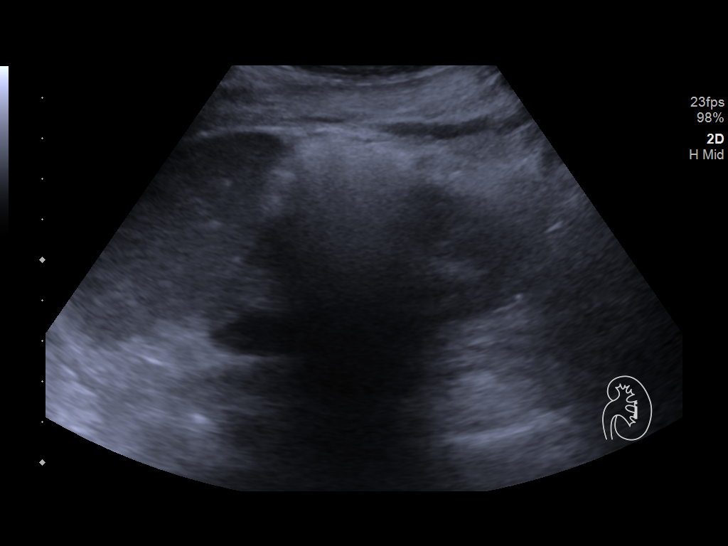
[im 19/31]
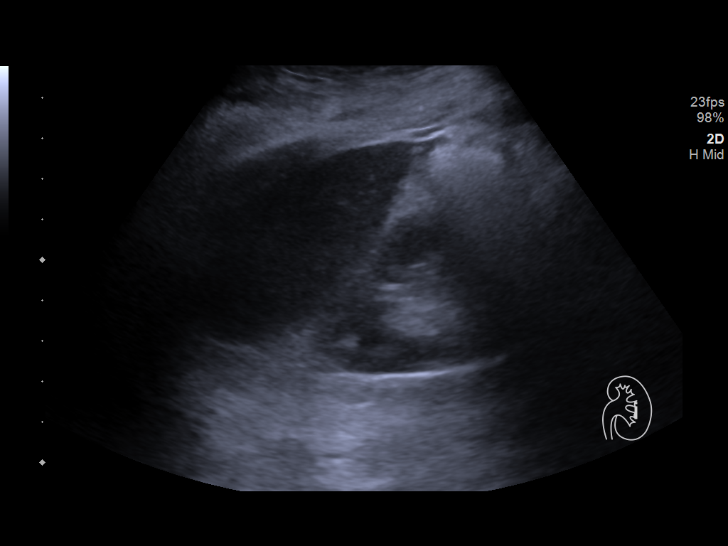
[im 21/31]
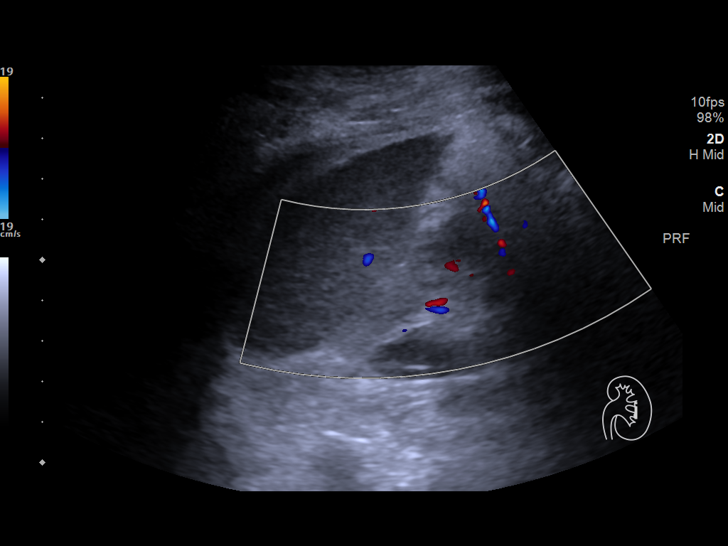
[im 23/31]
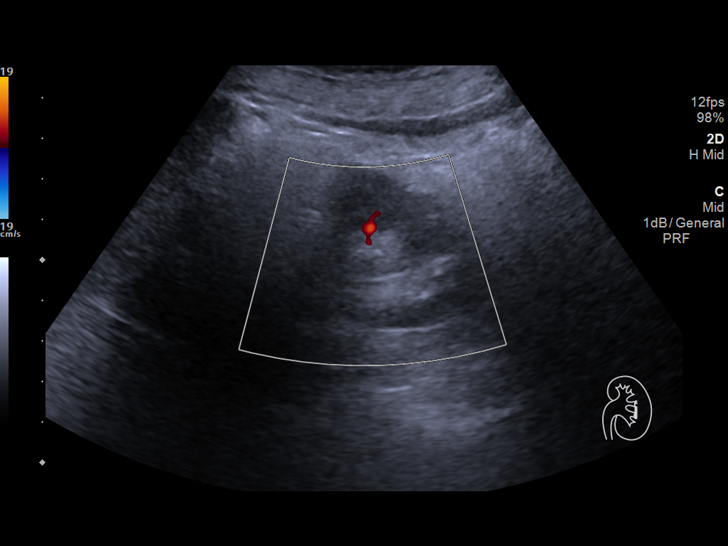
[im 26/31]
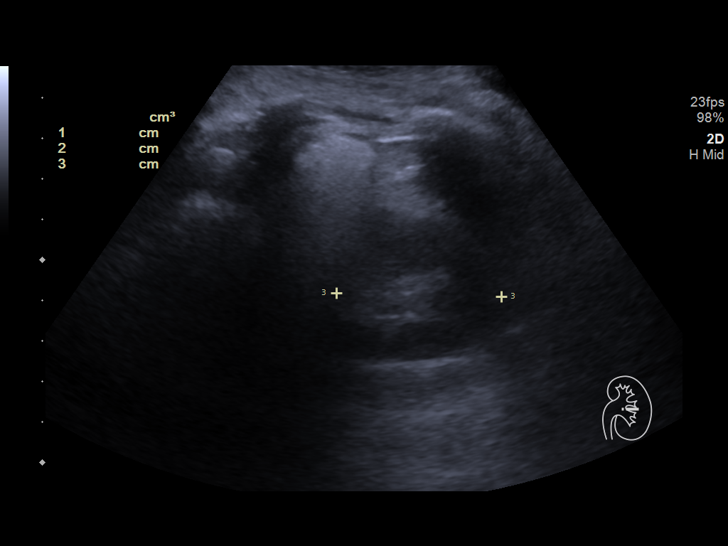
[im 28/31]
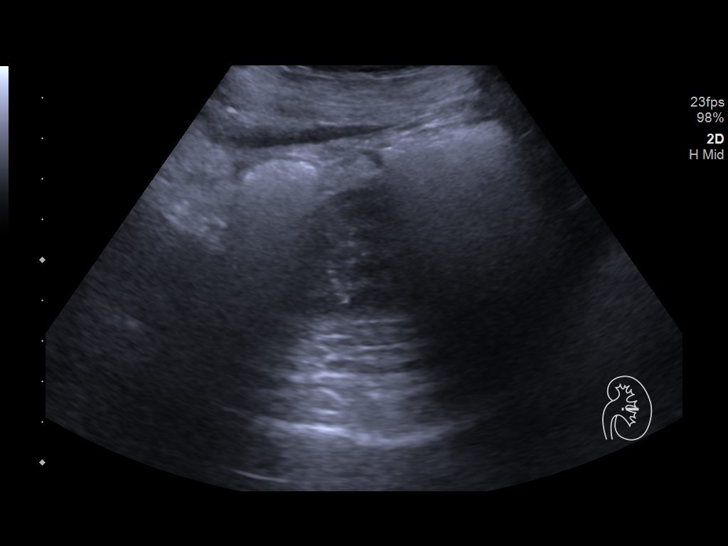
[im 31/31]
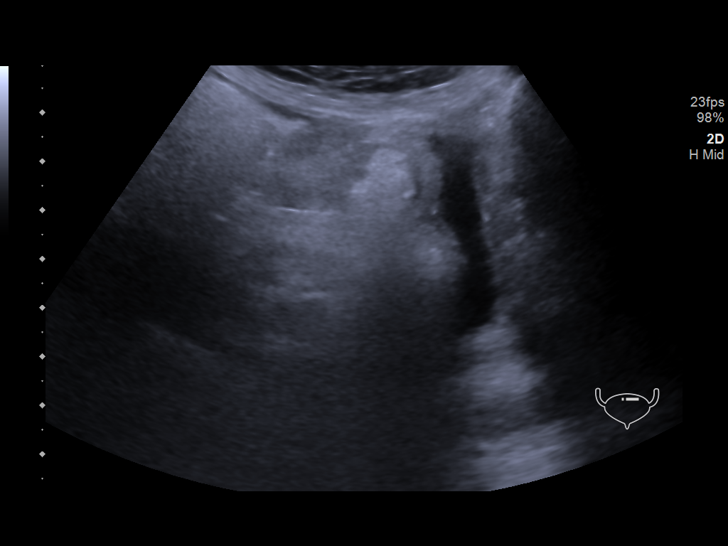

[14 of 25 positions shown; findings below may reference images not displayed]

FINDINGS: Right Kidney:

Renal measurements: 7.4 x 3.7 x 3.9 cm = volume: 54 mL .
Echogenicity within normal limits. No mass or hydronephrosis
visualized.

Left Kidney:

Renal measurements: 7.4 x 4.1 x 4.1 cm = volume: 65 mL. Echogenicity
within normal limits. No mass or hydronephrosis visualized.

Bladder:

Thick-walled/decompressed.

Other:

Left pleural effusion.
IMPRESSION: Small bilateral kidneys.  No hydronephrosis.

Bladder is thick-walled although decompressed.
